# Patient Record
Sex: Male | Born: 1935 | Race: White | Hispanic: No | Marital: Married | Smoking: Never smoker
Health system: Southern US, Community
[De-identification: ages and names within clinical notes are randomized; demographics above are authoritative.]

## PROBLEM LIST (undated history)

## (undated) DIAGNOSIS — N400 Enlarged prostate without lower urinary tract symptoms: Secondary | ICD-10-CM

## (undated) DIAGNOSIS — I10 Essential (primary) hypertension: Secondary | ICD-10-CM

## (undated) DIAGNOSIS — F32A Depression, unspecified: Secondary | ICD-10-CM

## (undated) DIAGNOSIS — K219 Gastro-esophageal reflux disease without esophagitis: Secondary | ICD-10-CM

## (undated) DIAGNOSIS — F0390 Unspecified dementia without behavioral disturbance: Secondary | ICD-10-CM

## (undated) DIAGNOSIS — J301 Allergic rhinitis due to pollen: Secondary | ICD-10-CM

## (undated) DIAGNOSIS — F329 Major depressive disorder, single episode, unspecified: Secondary | ICD-10-CM

## (undated) DIAGNOSIS — E785 Hyperlipidemia, unspecified: Secondary | ICD-10-CM

## (undated) DIAGNOSIS — M159 Polyosteoarthritis, unspecified: Secondary | ICD-10-CM

## (undated) DIAGNOSIS — F4310 Post-traumatic stress disorder, unspecified: Secondary | ICD-10-CM

## (undated) HISTORY — DX: Major depressive disorder, single episode, unspecified: F32.9

## (undated) HISTORY — PX: CATARACT EXTRACTION: SUR2

## (undated) HISTORY — DX: Unspecified dementia without behavioral disturbance: F03.90

## (undated) HISTORY — DX: Hyperlipidemia, unspecified: E78.5

## (undated) HISTORY — PX: TONSILLECTOMY: SUR1361

## (undated) HISTORY — DX: Allergic rhinitis due to pollen: J30.1

## (undated) HISTORY — PX: NOSE SURGERY: SHX723

## (undated) HISTORY — DX: Polyosteoarthritis, unspecified: M15.9

## (undated) HISTORY — DX: Gastro-esophageal reflux disease without esophagitis: K21.9

## (undated) HISTORY — DX: Post-traumatic stress disorder, unspecified: F43.10

## (undated) HISTORY — DX: Depression, unspecified: F32.A

## (undated) HISTORY — DX: Essential (primary) hypertension: I10

## (undated) HISTORY — DX: Benign prostatic hyperplasia without lower urinary tract symptoms: N40.0

## (undated) HISTORY — PX: OTHER SURGICAL HISTORY: SHX169

## (undated) HISTORY — PX: APPENDECTOMY: SHX54

---

## 2004-07-23 ENCOUNTER — Ambulatory Visit: Payer: Self-pay | Admitting: Family Medicine

## 2004-08-07 ENCOUNTER — Ambulatory Visit: Payer: Self-pay | Admitting: Family Medicine

## 2006-05-13 ENCOUNTER — Emergency Department: Payer: Self-pay | Admitting: Emergency Medicine

## 2007-02-21 ENCOUNTER — Ambulatory Visit: Payer: Self-pay | Admitting: Family Medicine

## 2008-01-18 ENCOUNTER — Inpatient Hospital Stay: Payer: Self-pay | Admitting: General Surgery

## 2008-01-18 ENCOUNTER — Other Ambulatory Visit: Payer: Self-pay

## 2008-10-30 ENCOUNTER — Emergency Department: Payer: Self-pay | Admitting: Emergency Medicine

## 2008-11-02 ENCOUNTER — Inpatient Hospital Stay: Payer: Self-pay | Admitting: Internal Medicine

## 2009-02-03 ENCOUNTER — Emergency Department: Payer: Self-pay | Admitting: Emergency Medicine

## 2009-04-24 ENCOUNTER — Telehealth: Payer: Self-pay | Admitting: Internal Medicine

## 2009-06-24 ENCOUNTER — Observation Stay: Payer: Self-pay | Admitting: *Deleted

## 2009-09-18 ENCOUNTER — Ambulatory Visit: Payer: Self-pay | Admitting: Cardiovascular Disease

## 2009-09-18 ENCOUNTER — Ambulatory Visit: Payer: Self-pay | Admitting: Ophthalmology

## 2009-09-23 ENCOUNTER — Emergency Department: Payer: Self-pay | Admitting: Unknown Physician Specialty

## 2009-10-06 ENCOUNTER — Ambulatory Visit: Payer: Self-pay | Admitting: Ophthalmology

## 2010-11-12 ENCOUNTER — Encounter: Payer: Self-pay | Admitting: Podiatry

## 2011-07-13 DIAGNOSIS — F0390 Unspecified dementia without behavioral disturbance: Secondary | ICD-10-CM

## 2011-07-13 HISTORY — DX: Unspecified dementia, unspecified severity, without behavioral disturbance, psychotic disturbance, mood disturbance, and anxiety: F03.90

## 2011-08-11 ENCOUNTER — Emergency Department: Payer: Self-pay | Admitting: Emergency Medicine

## 2011-10-04 ENCOUNTER — Emergency Department: Payer: Self-pay

## 2011-10-04 LAB — CBC
HCT: 44.5 % (ref 40.0–52.0)
HGB: 15 g/dL (ref 13.0–18.0)
MCH: 29.4 pg (ref 26.0–34.0)
MCHC: 33.8 g/dL (ref 32.0–36.0)
MCV: 87 fL (ref 80–100)
Platelet: 170 10*3/uL (ref 150–440)
RBC: 5.11 10*6/uL (ref 4.40–5.90)

## 2011-10-04 LAB — BASIC METABOLIC PANEL
Anion Gap: 13 (ref 7–16)
Calcium, Total: 9.2 mg/dL (ref 8.5–10.1)
Chloride: 104 mmol/L (ref 98–107)
Co2: 28 mmol/L (ref 21–32)
Creatinine: 1.14 mg/dL (ref 0.60–1.30)
EGFR (Non-African Amer.): >60
Potassium: 3 mmol/L — ABNORMAL LOW (ref 3.5–5.1)
Sodium: 145 mmol/L (ref 136–145)

## 2011-11-05 DIAGNOSIS — F068 Other specified mental disorders due to known physiological condition: Secondary | ICD-10-CM

## 2011-11-05 DIAGNOSIS — I10 Essential (primary) hypertension: Secondary | ICD-10-CM

## 2011-11-05 DIAGNOSIS — E785 Hyperlipidemia, unspecified: Secondary | ICD-10-CM

## 2011-11-05 DIAGNOSIS — K219 Gastro-esophageal reflux disease without esophagitis: Secondary | ICD-10-CM

## 2011-11-19 ENCOUNTER — Encounter: Payer: Self-pay | Admitting: Internal Medicine

## 2011-11-19 DIAGNOSIS — I10 Essential (primary) hypertension: Secondary | ICD-10-CM | POA: Insufficient documentation

## 2011-11-19 DIAGNOSIS — M159 Polyosteoarthritis, unspecified: Secondary | ICD-10-CM | POA: Insufficient documentation

## 2011-11-19 DIAGNOSIS — J301 Allergic rhinitis due to pollen: Secondary | ICD-10-CM | POA: Insufficient documentation

## 2011-11-19 DIAGNOSIS — E785 Hyperlipidemia, unspecified: Secondary | ICD-10-CM | POA: Insufficient documentation

## 2011-11-19 DIAGNOSIS — F329 Major depressive disorder, single episode, unspecified: Secondary | ICD-10-CM | POA: Insufficient documentation

## 2011-11-19 DIAGNOSIS — F0390 Unspecified dementia without behavioral disturbance: Secondary | ICD-10-CM | POA: Insufficient documentation

## 2011-11-19 DIAGNOSIS — K219 Gastro-esophageal reflux disease without esophagitis: Secondary | ICD-10-CM | POA: Insufficient documentation

## 2011-11-19 DIAGNOSIS — F4310 Post-traumatic stress disorder, unspecified: Secondary | ICD-10-CM | POA: Insufficient documentation

## 2011-11-25 ENCOUNTER — Non-Acute Institutional Stay: Payer: Medicare Other | Admitting: Internal Medicine

## 2011-11-25 ENCOUNTER — Encounter: Payer: Self-pay | Admitting: Internal Medicine

## 2011-11-25 VITALS — BP 140/56 | HR 58 | Temp 97.6°F | Resp 16 | Wt 181.0 lb

## 2011-11-25 DIAGNOSIS — I10 Essential (primary) hypertension: Secondary | ICD-10-CM

## 2011-11-25 DIAGNOSIS — F0390 Unspecified dementia without behavioral disturbance: Secondary | ICD-10-CM

## 2011-11-25 DIAGNOSIS — E785 Hyperlipidemia, unspecified: Secondary | ICD-10-CM

## 2011-11-25 DIAGNOSIS — K219 Gastro-esophageal reflux disease without esophagitis: Secondary | ICD-10-CM

## 2011-11-25 DIAGNOSIS — J301 Allergic rhinitis due to pollen: Secondary | ICD-10-CM

## 2011-11-25 DIAGNOSIS — M159 Polyosteoarthritis, unspecified: Secondary | ICD-10-CM

## 2011-11-25 NOTE — Assessment & Plan Note (Signed)
Okay on current meds

## 2011-11-25 NOTE — Progress Notes (Signed)
Subjective:    Patient ID: Ricardo Edwards, male    DOB: [birth year], 78 y.o.   MRN: [id number]  HPI Initial visit here in assisted living Just transferred from health care Niece who is POA is here also  Confusion cleared up fairly well Still gets mixed up but much better Needs reminders but does all personal care Staff supervises medications  Happy with the meals Eating well No apparent weight change  Able to walk to health care to see wife twice a day Uses cane  No chest pain  No SOB No sig edema  Stomach has been okay May get momentary indigestion but nothing lasting On the pepcid  Some right hip arthritis pain Rarely uses tylenol  Current Outpatient Prescriptions on File Prior to Visit  Medication Sig Dispense Refill  . amLODipine (NORVASC) 10 MG tablet Take 10 mg by mouth daily.      Marland Kitchen aspirin EC 81 MG tablet Take 81 mg by mouth daily.      . famotidine (PEPCID) 20 MG tablet Take 20 mg by mouth daily.      . fexofenadine (ALLEGRA) 180 MG tablet Take 180 mg by mouth daily as needed.      . fluticasone (FLONASE) 50 MCG/ACT nasal spray Place 2 sprays into the nose daily.      . isosorbide mononitrate (IMDUR) 60 MG 24 hr tablet Take 60 mg by mouth daily.      . polyethylene glycol (MIRALAX / GLYCOLAX) packet Take 17 g by mouth daily.      . potassium chloride SA (K-DUR,KLOR-CON) 20 MEQ tablet Take 20 mEq by mouth 2 (two) times daily.      . psyllium (METAMUCIL SMOOTH TEXTURE) 28 % packet Take 1 packet by mouth daily.      Marland Kitchen triamterene-hydrochlorothiazide (DYAZIDE) 37.5-25 MG per capsule Take 1 capsule by mouth daily.        Allergies  Allergen Reactions  . Metoprolol     Just listed as "beta blockers"  . Penicillins     Past Medical History  Diagnosis Date  . Hearing loss   . Dementia 2013  . PTSD (post-traumatic stress disorder)   . Hyperlipidemia   . GERD (gastroesophageal reflux disease)   . Allergic rhinitis due to pollen   . Osteoarthrosis involving, or  with mention of more than one site, but not specified as generalized, multiple sites   . HTN (hypertension), benign   . Depression     Past Surgical History  Procedure Date  . Appendectomy   . Ear lobe   . Cataract extraction   . Nose surgery   . Tonsillectomy     Family History  Problem Relation Age of Onset  . Diabetes Father   . Hypertension Father   . Gout      NOT KNOWN    History   Social History  . Marital Status: Married    Spouse Name: N/A    Number of Children: 0  . Years of Education: N/A   Occupational History  . 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 Smoking status: Never Smoker   . Smokeless tobacco: Never Used  . Alcohol Use: No  . Drug Use: No  . Sexually Active: Not on file   Other Topics Concern  . Not on file   Social History Narrative   Wife has advanced dementia in health careWife's niece has health care POA.No living willWould accept  CPR for nowNo tube feeds if cognitively unaware   Review of Systems Bowels are okay Notes some swelling in veins in legs--discussed support socks Voids okay--nocturia every 3 hours is stable    Objective:   Physical Exam  Constitutional: He appears well-nourished. No distress.  Neck: Normal range of motion. Neck supple. No thyromegaly present.  Cardiovascular: Normal rate and regular rhythm.  Exam reveals no gallop.   Murmur heard.      Gr 2.6 systolic murmur along left sternal border Faint pedal pulses  Pulmonary/Chest: Effort normal and breath sounds normal. No respiratory distress. He has no wheezes. He has no rales.  Abdominal: Soft. There is no tenderness.  Musculoskeletal: He exhibits no edema and no tenderness.       Dilated varicosities mostly on left calf  Lymphadenopathy:    He has no cervical adenopathy.  Skin:       No ulcers  Psychiatric: He has a normal mood and affect. His behavior is normal.          Assessment & Plan:

## 2011-11-25 NOTE — Assessment & Plan Note (Signed)
The statin might have been involved in his cognitive decline--since no vascular disease---will keep off it

## 2011-11-25 NOTE — Assessment & Plan Note (Signed)
stomach fine on the H2 blocker

## 2011-11-25 NOTE — Assessment & Plan Note (Signed)
Mild memory problems Much better now Seems to have had a component of stress reaction that landed him in health care Appropriate for assisted living now

## 2011-11-25 NOTE — Assessment & Plan Note (Signed)
BP Readings from Last 3 Encounters:  11/25/11 140/56  11/12/10 172/74   Control is fine Not sure why he is on isosorbide---will stop

## 2011-11-25 NOTE — Assessment & Plan Note (Signed)
Mild symptoms Will let him keep tylenol in his room for prn use

## 2011-12-01 ENCOUNTER — Encounter: Payer: Self-pay | Admitting: Internal Medicine

## 2012-01-07 ENCOUNTER — Telehealth: Payer: Self-pay | Admitting: Internal Medicine

## 2012-01-07 NOTE — Telephone Encounter (Signed)
email from [place] Ongoing anxiety Joked about jumping out of window---but no true suicidal ideation Mood not great---?adjustment issues  Repeat potassium 3.1 (she got lab before me)  He wants to try voltaren gel again  P: trial of citaloprma    Okay voltaren    Increase potassium and recheck in 1 month

## 2012-01-14 ENCOUNTER — Telehealth: Payer: Self-pay

## 2012-01-14 NOTE — Telephone Encounter (Signed)
Dr Alphonsus Sias pt; Morrie Sheldon nurse with [facility]; pt was taking K in the morning and at 2 pm. Pt did not want to take at 2 and changed to 6 pm; now pt does not want to take at 6 pm and wants to take K at 7- 8 am and again at 12 noon. Is that OK?

## 2012-01-16 NOTE — Telephone Encounter (Signed)
I don't see why that would be a problem

## 2012-01-27 ENCOUNTER — Encounter: Payer: Self-pay | Admitting: Internal Medicine

## 2012-01-31 ENCOUNTER — Encounter: Payer: Self-pay | Admitting: *Deleted

## 2012-02-02 NOTE — Telephone Encounter (Signed)
Ricardo Edwards, has [facility] been contacted? If so can encounter be closed?

## 2012-02-02 NOTE — Telephone Encounter (Signed)
[facility] and verified that it was ok to switch the medicine time.

## 2012-02-10 ENCOUNTER — Encounter: Payer: Self-pay | Admitting: Internal Medicine

## 2012-02-10 ENCOUNTER — Ambulatory Visit: Payer: Medicare Other | Admitting: Internal Medicine

## 2012-02-10 VITALS — BP 148/54 | HR 54 | Temp 98.3°F | Resp 16 | Wt 184.0 lb

## 2012-02-10 DIAGNOSIS — K219 Gastro-esophageal reflux disease without esophagitis: Secondary | ICD-10-CM

## 2012-02-10 DIAGNOSIS — F0390 Unspecified dementia without behavioral disturbance: Secondary | ICD-10-CM

## 2012-02-10 DIAGNOSIS — F4310 Post-traumatic stress disorder, unspecified: Secondary | ICD-10-CM

## 2012-02-10 DIAGNOSIS — I10 Essential (primary) hypertension: Secondary | ICD-10-CM

## 2012-02-10 NOTE — Assessment & Plan Note (Signed)
Having nighttime symptoms Will add evening famotidine

## 2012-02-10 NOTE — Progress Notes (Signed)
Subjective:    Patient ID: Dantrell Schertzer, male    DOB: Dec 09, 1921, 78 y.o.   MRN: [id number]  HPI Reviewed status with Albin Felling RN Niece is here  He notes slowed thinking--trouble with memory and thinking Increased confusion--called niece in panic at 4AM telling her something terrible happened in city and she shouldn't come (actually the fire alarm went off) Last week voiced thinking about suicide or jumping out window He denies suicidal thinking now---"I tend to exaggerate sometimes" Psychiatric consultation set up  Actually seems better in mood and affect since citalopram stopped  No stomach troubles Satisfied with reflux Rx  No chest pain but he feels "it is getting higher"---feels his stomach "is filling up quicker" Not clear if this is GERD related Only at night No SOB  Current Outpatient Prescriptions on File Prior to Visit  Medication Sig Dispense Refill  . amLODipine (NORVASC) 10 MG tablet Take 10 mg by mouth daily.      Marland Kitchen aspirin EC 81 MG tablet Take 81 mg by mouth daily.      . diclofenac sodium (VOLTAREN) 1 % GEL Apply 1 application topically 3 (three) times daily as needed.      . famotidine (PEPCID) 20 MG tablet Take 20 mg by mouth daily.      . fexofenadine (ALLEGRA) 180 MG tablet Take 180 mg by mouth daily as needed.      . fluticasone (FLONASE) 50 MCG/ACT nasal spray Place 2 sprays into the nose daily.      . polyethylene glycol (MIRALAX / GLYCOLAX) packet Take 17 g by mouth daily.      . potassium chloride SA (K-DUR,KLOR-CON) 20 MEQ tablet Take 40 mEq by mouth 2 (two) times daily.       . psyllium (METAMUCIL SMOOTH TEXTURE) 28 % packet Take 1 packet by mouth daily.      Marland Kitchen triamterene-hydrochlorothiazide (DYAZIDE) 37.5-25 MG per capsule Take 1 capsule by mouth daily.        Allergies  Allergen Reactions  . Metoprolol     Just listed as "beta blockers"  . Penicillins     Past Medical History  Diagnosis Date  . Hearing loss   . Dementia 2013  . PTSD  (post-traumatic stress disorder)   . Hyperlipidemia   . GERD (gastroesophageal reflux disease)   . Allergic rhinitis due to pollen   . Osteoarthrosis involving, or with mention of more than one site, but not specified as generalized, multiple sites   . HTN (hypertension), benign   . Depression     Past Surgical History  Procedure Date  . Appendectomy   . Ear lobe   . Cataract extraction   . Nose surgery   . Tonsillectomy     Family History  Problem Relation Age of Onset  . Diabetes Father   . Hypertension Father   . Gout      NOT KNOWN    History   Social History  . Marital Status: Married    Spouse Name: N/A    Number of Children: 0  . Years of Education: N/A   Occupational History  . 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 Smoking status: Never Smoker   . Smokeless tobacco: Never Used  . Alcohol Use: No  . Drug Use: No  . Sexually Active: Not on file   Other Topics Concern  . Not on file   Social History Narrative   Wife  has advanced dementia in health careWife's niece has health care POA.No living willWould accept CPR for nowNo tube feeds if cognitively unaware   Review of Systems Appetite is okay Weight stable Doesn't sleep great at times but generally okay. Relaxes better if he has seen wife Walks over to health care to see her---enjoys the exercise Left calf lesion--apparent cancer. Having this excised soon     Objective:   Physical Exam  Constitutional: He appears well-developed and well-nourished. No distress.  Neck: Normal range of motion. Neck supple. No thyromegaly present.  Cardiovascular: Normal rate, regular rhythm and normal heart sounds.  Exam reveals no gallop.   No murmur heard. Pulmonary/Chest: Effort normal and breath sounds normal. No respiratory distress. He has no wheezes. He has no rales.  Musculoskeletal: He exhibits no edema and no tenderness.  Lymphadenopathy:    He has no cervical  adenopathy.  Skin: No rash noted.  Psychiatric: He has a normal mood and affect. His behavior is normal.          Assessment & Plan:

## 2012-02-10 NOTE — Assessment & Plan Note (Signed)
Increased confusion  Mild reality issues---confused by fire alarm in middle of night, mild delusions related to wife No Rx

## 2012-02-10 NOTE — Assessment & Plan Note (Signed)
Mentioned suicidal thought but not suicidal Mild psychotic symptoms that don't warrant Rx at this point Will proceed with consultation with Dr Imogene Burn

## 2012-02-10 NOTE — Assessment & Plan Note (Signed)
BP Readings from Last 3 Encounters:  02/10/12 148/54  11/25/11 140/56  11/12/10 172/74   Control is adequate for his age No changes

## 2012-02-11 ENCOUNTER — Ambulatory Visit: Payer: Federal, State, Local not specified - PPO | Admitting: Internal Medicine

## 2012-04-12 ENCOUNTER — Ambulatory Visit: Payer: Self-pay | Admitting: Internal Medicine

## 2012-04-27 ENCOUNTER — Non-Acute Institutional Stay: Payer: Medicare Other | Admitting: Internal Medicine

## 2012-04-27 ENCOUNTER — Encounter: Payer: Self-pay | Admitting: Internal Medicine

## 2012-04-27 VITALS — BP 140/60 | HR 52 | Temp 97.2°F | Resp 20 | Wt 193.0 lb

## 2012-04-27 DIAGNOSIS — M159 Polyosteoarthritis, unspecified: Secondary | ICD-10-CM

## 2012-04-27 DIAGNOSIS — I498 Other specified cardiac arrhythmias: Secondary | ICD-10-CM

## 2012-04-27 DIAGNOSIS — K219 Gastro-esophageal reflux disease without esophagitis: Secondary | ICD-10-CM

## 2012-04-27 DIAGNOSIS — F329 Major depressive disorder, single episode, unspecified: Secondary | ICD-10-CM

## 2012-04-27 DIAGNOSIS — I10 Essential (primary) hypertension: Secondary | ICD-10-CM

## 2012-04-27 DIAGNOSIS — F0390 Unspecified dementia without behavioral disturbance: Secondary | ICD-10-CM

## 2012-04-27 DIAGNOSIS — R001 Bradycardia, unspecified: Secondary | ICD-10-CM | POA: Insufficient documentation

## 2012-04-27 NOTE — Assessment & Plan Note (Signed)
BP Readings from Last 3 Encounters:  04/27/12 140/60  02/10/12 148/54  11/25/11 140/56   Has been okay Some concern with low diastolics Will try decreasing the amlodipine given the bradycardia

## 2012-04-27 NOTE — Assessment & Plan Note (Signed)
No clear symptoms from this but does have vague head feeling Will cut back on amlodipine---could have small effect on heart rate and may help the edema that bothers him Will monitor BP

## 2012-04-27 NOTE — Assessment & Plan Note (Signed)
Cognition seems stable Affect has settled on the namenda--will continue No more perceptual problems (like panicking with the fire alarm)

## 2012-04-27 NOTE — Assessment & Plan Note (Signed)
Generally okay Does have some symptoms that could be from regurgitation No changes for now

## 2012-04-27 NOTE — Assessment & Plan Note (Signed)
Mood seems better on the namenda

## 2012-04-27 NOTE — Progress Notes (Signed)
Subjective:    Patient ID: Ricardo Edwards, male    DOB: 13-May-1922, 78 y.o.   MRN: [id number]  HPI Reviewed status with Albin Felling RN  Doing better mentally No worsening of memory Mood is better now---not having the emotional upheavals Not as confused Started on namenda by Dr Glynis Smiles be the reason why  Just saw Dr Lady Gary Concern due to bradycardia and PVCs on holter Discussed pacemaker but he wasn't interested No dizziness or syncope--still gets sense that "head is blooming" Hasn't fallen  Some trouble with his legs still Some swelling still Has one small red area that has persisted  No heartburn No swallowing problems but does get sense of closing up in throat at times---slows down his eating then  Not using the voltaren gel  Current Outpatient Prescriptions on File Prior to Visit  Medication Sig Dispense Refill  . amLODipine (NORVASC) 10 MG tablet Take 10 mg by mouth daily.      Marland Kitchen aspirin EC 81 MG tablet Take 81 mg by mouth daily.      . diclofenac sodium (VOLTAREN) 1 % GEL Apply 1 application topically 3 (three) times daily as needed.      . famotidine (PEPCID) 20 MG tablet Take 20 mg by mouth 2 (two) times daily.       . fexofenadine (ALLEGRA) 180 MG tablet Take 180 mg by mouth daily as needed.      . fluticasone (FLONASE) 50 MCG/ACT nasal spray Place 2 sprays into the nose daily.      . memantine (NAMENDA) 10 MG tablet Take 10 mg by mouth 2 (two) times daily.      . polyethylene glycol (MIRALAX / GLYCOLAX) packet Take 17 g by mouth daily.      . potassium chloride SA (K-DUR,KLOR-CON) 20 MEQ tablet Take 40 mEq by mouth 2 (two) times daily.       . psyllium (METAMUCIL SMOOTH TEXTURE) 28 % packet Take 1 packet by mouth daily.      Marland Kitchen triamterene-hydrochlorothiazide (DYAZIDE) 37.5-25 MG per capsule Take 1 capsule by mouth daily.        Allergies  Allergen Reactions  . Metoprolol     Just listed as "beta blockers"  . Penicillins     Past Medical History  Diagnosis Date  .  Hearing loss   . Dementia 2013  . PTSD (post-traumatic stress disorder)   . Hyperlipidemia   . GERD (gastroesophageal reflux disease)   . Allergic rhinitis due to pollen   . Osteoarthrosis involving, or with mention of more than one site, but not specified as generalized, multiple sites   . HTN (hypertension), benign   . Depression     Past Surgical History  Procedure Date  . Appendectomy   . Ear lobe   . Cataract extraction   . Nose surgery   . Tonsillectomy     Family History  Problem Relation Age of Onset  . Diabetes Father   . Hypertension Father   . Gout      NOT KNOWN    History   Social History  . Marital Status: Married    Spouse Name: N/A    Number of Children: 0  . Years of Education: N/A   Occupational History  . 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 Smoking status: Never Smoker   . Smokeless tobacco: Never Used  . Alcohol Use: No  . Drug Use: No  . Sexually  Active: Not on file   Other Topics Concern  . Not on file   Social History Narrative   Wife has advanced dementia in health careWife's niece has health care POA.No living willWould accept CPR for nowNo tube feeds if cognitively unaware   Review of Systems Pains in flanks are better Bowels have been fine with laxative Appetite is good Weight is back up ~8#    Objective:   Physical Exam  Constitutional: He appears well-developed and well-nourished. No distress.  Neck: Normal range of motion. Neck supple. No thyromegaly present.  Cardiovascular: Regular rhythm.  Exam reveals no gallop.   No murmur heard.      Mild bradycardia Occ skip beats   Pulmonary/Chest: Effort normal and breath sounds normal. No respiratory distress. He has no wheezes. He has no rales.  Abdominal: Soft. There is no tenderness.  Musculoskeletal: He exhibits edema.       Trace edema Mild swelling in varicosities in calves  Skin:       Small hyperpigmented macule on left  calf--not worrisome  Psychiatric: He has a normal mood and affect. His behavior is normal.          Assessment & Plan:  A

## 2012-04-27 NOTE — Assessment & Plan Note (Signed)
Hasn't been an issue Will stop the voltaren gel

## 2012-05-30 ENCOUNTER — Emergency Department: Payer: Self-pay | Admitting: Emergency Medicine

## 2012-05-30 LAB — COMPREHENSIVE METABOLIC PANEL
Albumin: 3.8 g/dL (ref 3.4–5.0)
Alkaline Phosphatase: 116 U/L (ref 50–136)
Anion Gap: 9 (ref 7–16)
BUN: 16 mg/dL (ref 7–18)
Bilirubin,Total: 0.5 mg/dL (ref 0.2–1.0)
Calcium, Total: 8.9 mg/dL (ref 8.5–10.1)
Chloride: 103 mmol/L (ref 98–107)
Creatinine: 1.09 mg/dL (ref 0.60–1.30)
Osmolality: 282 (ref 275–301)
SGPT (ALT): 24 U/L (ref 12–78)
Sodium: 141 mmol/L (ref 136–145)
Total Protein: 6.9 g/dL (ref 6.4–8.2)

## 2012-05-30 LAB — CBC
HCT: 45.5 % (ref 40.0–52.0)
HGB: 15.5 g/dL (ref 13.0–18.0)
MCH: 29.1 pg (ref 26.0–34.0)
MCHC: 34 g/dL (ref 32.0–36.0)
Platelet: 152 10*3/uL (ref 150–440)
RDW: 14.2 % (ref 11.5–14.5)
WBC: 6.6 10*3/uL (ref 3.8–10.6)

## 2012-05-30 LAB — CK TOTAL AND CKMB (NOT AT ARMC): CK-MB: 4.1 ng/mL — ABNORMAL HIGH (ref 0.5–3.6)

## 2012-05-30 LAB — TROPONIN I: Troponin-I: <0.02 ng/mL

## 2012-06-01 ENCOUNTER — Non-Acute Institutional Stay: Payer: Medicare Other | Admitting: Internal Medicine

## 2012-06-01 ENCOUNTER — Encounter: Payer: Self-pay | Admitting: Internal Medicine

## 2012-06-01 VITALS — BP 200/70 | HR 60 | Temp 97.6°F | Resp 14 | Wt 189.0 lb

## 2012-06-01 DIAGNOSIS — I10 Essential (primary) hypertension: Secondary | ICD-10-CM | POA: Insufficient documentation

## 2012-06-01 DIAGNOSIS — F0390 Unspecified dementia without behavioral disturbance: Secondary | ICD-10-CM

## 2012-06-01 NOTE — Addendum Note (Signed)
Addended by: Tillman Abide I on: 06/01/2012 10:10 AM   Modules accepted: Orders

## 2012-06-01 NOTE — Progress Notes (Signed)
Subjective:    Patient ID: Ricardo Edwards, male    DOB: 03/19/1922, 78 y.o.   MRN: [id number]  HPI Niece is here  Asked to see in ER follow up Sent due to confusion and BP up As high as 224/84 He remembers spell vaguely--saw people around his bed Realizes he was seeing things that weren't there  ER eval apparently negative No changes made BP was still high yesterday so extra 5mg  amlodipine given  No longer having sig hallucinations Occ sees "vision" out of corner of my eye (like diplopia with lateral gaze)  Has been having some headache No chest pain Breathing is okay---motes some trouble with nasal congestion Mild ankle edema ---uses support socks. No sig change  Current Outpatient Prescriptions on File Prior to Visit  Medication Sig Dispense Refill  . amLODipine (NORVASC) 10 MG tablet Take 5 mg by mouth daily.       Marland Kitchen aspirin EC 81 MG tablet Take 81 mg by mouth daily.      . famotidine (PEPCID) 20 MG tablet Take 20 mg by mouth 2 (two) times daily.       . fexofenadine (ALLEGRA) 180 MG tablet Take 180 mg by mouth daily as needed.      . fluticasone (FLONASE) 50 MCG/ACT nasal spray Place 2 sprays into the nose daily.      . memantine (NAMENDA) 10 MG tablet Take 10 mg by mouth 2 (two) times daily.      . metroNIDAZOLE (METROGEL) 1 % gel Apply 1 application topically daily.      . multivitamin-lutein (OCUVITE-LUTEIN) CAPS Take 1 capsule by mouth 2 (two) times daily.      . polyethylene glycol (MIRALAX / GLYCOLAX) packet Take 17 g by mouth daily.      . potassium chloride SA (K-DUR,KLOR-CON) 20 MEQ tablet Take 40 mEq by mouth 2 (two) times daily.       . psyllium (METAMUCIL SMOOTH TEXTURE) 28 % packet Take 1 packet by mouth daily.      Marland Kitchen triamterene-hydrochlorothiazide (DYAZIDE) 37.5-25 MG per capsule Take 1 capsule by mouth daily.        Allergies  Allergen Reactions  . Metoprolol     Just listed as "beta blockers"  . Penicillins     Past Medical History  Diagnosis Date    . Hearing loss   . Dementia 2013  . PTSD (post-traumatic stress disorder)   . Hyperlipidemia   . GERD (gastroesophageal reflux disease)   . Allergic rhinitis due to pollen   . Osteoarthrosis involving, or with mention of more than one site, but not specified as generalized, multiple sites   . HTN (hypertension), benign   . Depression     Past Surgical History  Procedure Date  . Appendectomy   . Ear lobe   . Cataract extraction   . Nose surgery   . Tonsillectomy     Family History  Problem Relation Age of Onset  . Diabetes Father   . Hypertension Father   . Gout      NOT KNOWN    History   Social History  . Marital Status: Married    Spouse Name: N/A    Number of Children: 0  . Years of Education: N/A   Occupational History  . 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 Smoking status: Never Smoker   . Smokeless tobacco: Never Used  . Alcohol Use: No  .  Drug Use: No  . Sexually Active: Not on file   Other Topics Concern  . Not on file   Social History Narrative   Wife has advanced dementia in health careWife's niece has health care POA.No living willWould accept CPR for nowNo tube feeds if cognitively unaware   Review of Systems Some irritation on chest where emergency beacon lies Appetite is okay Sleeping okay    Objective:   Physical Exam  Constitutional: He appears well-developed and well-nourished. No distress.  Neck: Normal range of motion. Neck supple.  Cardiovascular: Normal rate, regular rhythm and normal heart sounds.  Exam reveals no gallop.   No murmur heard. Pulmonary/Chest: Effort normal and breath sounds normal. No respiratory distress. He has no wheezes. He has no rales.  Abdominal:       No renal bruits  Musculoskeletal:       Trace edema  Lymphadenopathy:    He has no cervical adenopathy.  Neurological:       No thought process disturbance Has insight---remembers the hallucinations and  details of ER visit  Psychiatric: He has a normal mood and affect. His behavior is normal.          Assessment & Plan:

## 2012-06-01 NOTE — Assessment & Plan Note (Signed)
Had episode of increased confusion and visual hallucinations with increased BP Seems back to baseline now

## 2012-06-01 NOTE — Assessment & Plan Note (Signed)
Repeat after our talking was 224/78 Clear now mentally No overt symptoms but needs more aggressive Rx Will get and review the ER records  Increase amlodipine to 10 Change diuretic to lisinopril/HCTZ 20/25 Watch BP and labs

## 2012-06-26 ENCOUNTER — Encounter: Payer: Self-pay | Admitting: Internal Medicine

## 2012-07-12 DEATH — deceased

## 2012-07-24 ENCOUNTER — Encounter: Payer: Self-pay | Admitting: Internal Medicine

## 2012-07-24 DIAGNOSIS — Z0279 Encounter for issue of other medical certificate: Secondary | ICD-10-CM

## 2012-08-31 ENCOUNTER — Non-Acute Institutional Stay: Payer: Medicare Other | Admitting: Internal Medicine

## 2012-08-31 ENCOUNTER — Encounter: Payer: Self-pay | Admitting: Internal Medicine

## 2012-08-31 VITALS — BP 152/60 | HR 50 | Temp 97.9°F | Resp 18 | Wt 192.0 lb

## 2012-08-31 DIAGNOSIS — I10 Essential (primary) hypertension: Secondary | ICD-10-CM

## 2012-08-31 DIAGNOSIS — F0390 Unspecified dementia without behavioral disturbance: Secondary | ICD-10-CM

## 2012-08-31 DIAGNOSIS — F329 Major depressive disorder, single episode, unspecified: Secondary | ICD-10-CM

## 2012-08-31 DIAGNOSIS — M159 Polyosteoarthritis, unspecified: Secondary | ICD-10-CM

## 2012-08-31 DIAGNOSIS — K219 Gastro-esophageal reflux disease without esophagitis: Secondary | ICD-10-CM

## 2012-08-31 DIAGNOSIS — F4310 Post-traumatic stress disorder, unspecified: Secondary | ICD-10-CM

## 2012-08-31 NOTE — Assessment & Plan Note (Signed)
Quiet on the med 

## 2012-08-31 NOTE — Assessment & Plan Note (Signed)
Psychotic features have resolved Could be complicated depression too--but did seem to have intrusive thoughts before and flashbacks

## 2012-08-31 NOTE — Assessment & Plan Note (Signed)
Really seems to be better Mood is improved No longer focusing on wife's death Continue paroxetine

## 2012-08-31 NOTE — Assessment & Plan Note (Signed)
Mild memory deficits and some functional needs Stable Will continue the namenda as it may be helping affect

## 2012-08-31 NOTE — Assessment & Plan Note (Signed)
BP Readings from Last 3 Encounters:  08/31/12 152/60  06/01/12 200/70  04/27/12 140/60   Controlled now on current regimen Renal panel normal on these meds also

## 2012-08-31 NOTE — Assessment & Plan Note (Signed)
Tylenol not helping as much Will increase the dose

## 2012-08-31 NOTE — Progress Notes (Signed)
Subjective:    Patient ID: Ricardo Edwards, male    DOB: May 05, 1922, 79 y.o.   MRN: [id number]  HPI Reviewed status with Albin Felling RN Doing fairly well  No sig problems with BP of late Occasional mild occipital headache No chest pain Notes occ skip in heart No dizziness or syncope Has mild calf swelling---still wears the support socks  Seeing psychiatrist Dr Imogene Burn Doing better Now on paroxetine No hallucinations or delusions---relates some of the vision issues to his glasses  Stomach has been fine No heartburn issues  Does have some arthritis pain Tylenol 500mg  helps only a little--thinks he needs higher dose  Current Outpatient Prescriptions on File Prior to Visit  Medication Sig Dispense Refill  . amLODipine (NORVASC) 10 MG tablet Take 10 mg by mouth daily.       Marland Kitchen aspirin EC 81 MG tablet Take 81 mg by mouth daily.      . famotidine (PEPCID) 20 MG tablet Take 20 mg by mouth 2 (two) times daily.       . fexofenadine (ALLEGRA) 180 MG tablet Take 180 mg by mouth daily as needed.      . fluticasone (FLONASE) 50 MCG/ACT nasal spray Place 2 sprays into the nose daily.      Marland Kitchen lisinopril-hydrochlorothiazide (PRINZIDE,ZESTORETIC) 20-25 MG per tablet Take 1 tablet by mouth daily.      . memantine (NAMENDA) 10 MG tablet Take 10 mg by mouth 2 (two) times daily.      . metroNIDAZOLE (METROGEL) 1 % gel Apply 1 application topically daily.      . multivitamin-lutein (OCUVITE-LUTEIN) CAPS Take 1 capsule by mouth 2 (two) times daily.      . potassium chloride SA (K-DUR,KLOR-CON) 20 MEQ tablet Take 40 mEq by mouth 2 (two) times daily.        No current facility-administered medications on file prior to visit.    Allergies  Allergen Reactions  . Metoprolol     Just listed as "beta blockers"  . Penicillins     Past Medical History  Diagnosis Date  . Hearing loss   . Dementia 2013  . PTSD (post-traumatic stress disorder)   . Hyperlipidemia   . GERD (gastroesophageal reflux disease)   .  Allergic rhinitis due to pollen   . Osteoarthrosis involving, or with mention of more than one site, but not specified as generalized, multiple sites   . HTN (hypertension), benign   . Depression     Past Surgical History  Procedure Laterality Date  . Appendectomy    . Ear lobe    . Cataract extraction    . Nose surgery    . Tonsillectomy      Family History  Problem Relation Age of Onset  . Diabetes Father   . Hypertension Father   . Gout      NOT KNOWN    History   Social History  . Marital Status: Married    Spouse Name: N/A    Number of Children: 0  . Years of Education: N/A   Occupational History  . 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 Smoking status: Never Smoker   . Smokeless tobacco: Never Used  . Alcohol Use: No  . Drug Use: No  . Sexually Active: Not on file   Other Topics Concern  . Not on file   Social History Narrative   Wife has advanced dementia in health care   Wife's  niece has health care POA.   No living will   Would accept CPR for now   No tube feeds if cognitively unaware   Review of Systems Sleeps well Appetite is "too good" Weight is up about 3# Had some loose stools ----better now off miralax and metamucil     Objective:   Physical Exam  Constitutional: He appears well-developed and well-nourished. No distress.  Neck: Normal range of motion. Neck supple. No thyromegaly present.  Cardiovascular: Regular rhythm and normal heart sounds.  Exam reveals no gallop.   No murmur heard. Mild bradycardia  Pulmonary/Chest: Effort normal and breath sounds normal. No respiratory distress. He has no wheezes. He has no rales.  Abdominal: Soft. There is no tenderness.  Musculoskeletal: He exhibits edema.  Trace calf edema without sig pitting  Lymphadenopathy:    He has no cervical adenopathy.  Psychiatric: He has a normal mood and affect. His behavior is normal.  Normal conversation and  interaction          Assessment & Plan:

## 2012-11-02 ENCOUNTER — Non-Acute Institutional Stay (INDEPENDENT_AMBULATORY_CARE_PROVIDER_SITE_OTHER): Payer: Medicare Other | Admitting: Internal Medicine

## 2012-11-02 ENCOUNTER — Encounter: Payer: Self-pay | Admitting: Internal Medicine

## 2012-11-02 VITALS — BP 140/58 | HR 52 | Temp 98.3°F | Resp 14 | Wt 198.0 lb

## 2012-11-02 DIAGNOSIS — J301 Allergic rhinitis due to pollen: Secondary | ICD-10-CM

## 2012-11-02 DIAGNOSIS — I10 Essential (primary) hypertension: Secondary | ICD-10-CM

## 2012-11-02 DIAGNOSIS — M159 Polyosteoarthritis, unspecified: Secondary | ICD-10-CM

## 2012-11-02 DIAGNOSIS — F0390 Unspecified dementia without behavioral disturbance: Secondary | ICD-10-CM

## 2012-11-02 DIAGNOSIS — F3289 Other specified depressive episodes: Secondary | ICD-10-CM

## 2012-11-02 DIAGNOSIS — F329 Major depressive disorder, single episode, unspecified: Secondary | ICD-10-CM

## 2012-11-02 NOTE — Assessment & Plan Note (Signed)
Mild with perhaps slight progression Doesn't seem to be Alzheimer's pattern Still pretty much independent with ADLs--- appropriate for assisted living still

## 2012-11-02 NOTE — Assessment & Plan Note (Signed)
Chronic and he admits he has always been "moody" Still grieving wife No longer significant psychosis though mild paranoia at times Needs to stay on meds

## 2012-11-02 NOTE — Assessment & Plan Note (Signed)
Just using the flonase now

## 2012-11-02 NOTE — Progress Notes (Signed)
Subjective:    Patient ID: Ricardo Edwards, male    DOB: [birth year], 79 y.o.   MRN: [id number]  HPI Reviewed status with Albin Felling RN  Still acts unusual at times--mild paranoia Still depressed at times---he admits this.  Doesn't feel this is any worse and still grieves wife Some degree of anhedonia Still seeing Dr Imogene Burn  Mild confusion persists Doesn't seem to be much worse--he notes slight decline Still able to be independent  Walks regular  Uses cane No recent falls Can have some pain in joints---seems to be better after walking (or uses tylenol)  No chest pain or SOB No dizziness or syncope Mild edema--wears suppport socks and these help  Current Outpatient Prescriptions on File Prior to Visit  Medication Sig Dispense Refill  . acetaminophen (TYLENOL) 500 MG tablet Take 1,000 mg by mouth 3 (three) times daily as needed for pain.       Marland Kitchen amLODipine (NORVASC) 10 MG tablet Take 10 mg by mouth daily.       Marland Kitchen aspirin EC 81 MG tablet Take 81 mg by mouth daily.      . famotidine (PEPCID) 20 MG tablet Take 20 mg by mouth 2 (two) times daily.       . fluticasone (FLONASE) 50 MCG/ACT nasal spray Place 2 sprays into the nose daily.      Marland Kitchen lisinopril-hydrochlorothiazide (PRINZIDE,ZESTORETIC) 20-25 MG per tablet Take 1 tablet by mouth daily.      . memantine (NAMENDA) 10 MG tablet Take 10 mg by mouth 2 (two) times daily.      . metroNIDAZOLE (METROGEL) 1 % gel Apply 1 application topically daily.      . multivitamin-lutein (OCUVITE-LUTEIN) CAPS Take 1 capsule by mouth 2 (two) times daily.      Marland Kitchen PARoxetine (PAXIL) 10 MG tablet Take 10 mg by mouth daily.      . potassium chloride SA (K-DUR,KLOR-CON) 20 MEQ tablet Take 40 mEq by mouth 2 (two) times daily.        No current facility-administered medications on file prior to visit.    Allergies  Allergen Reactions  . Metoprolol     Just listed as "beta blockers"  . Penicillins     Past Medical History  Diagnosis Date  . Hearing loss    . Dementia 2013  . PTSD (post-traumatic stress disorder)   . Hyperlipidemia   . GERD (gastroesophageal reflux disease)   . Allergic rhinitis due to pollen   . Osteoarthrosis involving, or with mention of more than one site, but not specified as generalized, multiple sites   . HTN (hypertension), benign   . Depression     Past Surgical History  Procedure Laterality Date  . Appendectomy    . Ear lobe    . Cataract extraction    . Nose surgery    . Tonsillectomy      Family History  Problem Relation Age of Onset  . Diabetes Father   . Hypertension Father   . Gout      NOT KNOWN    History   Social History  . Marital Status: Married    Spouse Name: N/A    Number of Children: 0  . Years of Education: N/A   Occupational History  . 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 Smoking status: Never Smoker   . Smokeless tobacco: Never Used  . Alcohol Use: No  . Drug Use: No  .  Sexually Active: Not on file   Other Topics Concern  . Not on file   Social History Narrative   Wife has advanced dementia in health care   Wife's niece has health care POA.   No living will   Would accept CPR for now   No tube feeds if cognitively unaware   Review of Systems On the flonase for allergies. Not on fexofenadine now Appetite is fine Weight is stable---tries to be careful Sleeps fairly well--some early arising. Nocturia x 2 No stomach trouble--no regular heartburn    Objective:   Physical Exam  Constitutional: He appears well-developed and well-nourished. No distress.  Neck: Normal range of motion. Neck supple. No thyromegaly present.  Cardiovascular: Normal rate, regular rhythm and normal heart sounds.  Exam reveals no gallop.   No murmur heard. Rate up to 60 now  Pulmonary/Chest: Effort normal and breath sounds normal. No respiratory distress. He has no wheezes. He has no rales.  Abdominal: Soft. There is no tenderness.   Musculoskeletal: He exhibits no tenderness.  Trace edema in calves without pitting  Lymphadenopathy:    He has no cervical adenopathy.  Psychiatric:  Not overtly depressed Somewhat constricted affect Normal speech and appearance          Assessment & Plan:

## 2012-11-02 NOTE — Assessment & Plan Note (Signed)
Mild symptoms Does okay with the tylenol

## 2012-11-02 NOTE — Assessment & Plan Note (Signed)
BP Readings from Last 3 Encounters:  11/02/12 140/58  08/31/12 152/60  06/01/12 200/70   Has not been as labile No changes needed now

## 2012-12-20 ENCOUNTER — Other Ambulatory Visit: Payer: Self-pay | Admitting: Internal Medicine

## 2012-12-20 NOTE — Telephone Encounter (Signed)
rx sent to pharmacy by e-script  

## 2012-12-20 NOTE — Telephone Encounter (Signed)
Okay for a year---[facility]

## 2013-01-25 ENCOUNTER — Encounter: Payer: Self-pay | Admitting: Internal Medicine

## 2013-01-25 ENCOUNTER — Non-Acute Institutional Stay: Payer: Medicare Other | Admitting: Internal Medicine

## 2013-01-25 VITALS — BP 130/60 | HR 64 | Temp 98.6°F | Resp 18 | Wt 207.0 lb

## 2013-01-25 DIAGNOSIS — F4310 Post-traumatic stress disorder, unspecified: Secondary | ICD-10-CM

## 2013-01-25 DIAGNOSIS — F329 Major depressive disorder, single episode, unspecified: Secondary | ICD-10-CM

## 2013-01-25 DIAGNOSIS — M159 Polyosteoarthritis, unspecified: Secondary | ICD-10-CM

## 2013-01-25 DIAGNOSIS — I10 Essential (primary) hypertension: Secondary | ICD-10-CM

## 2013-01-25 DIAGNOSIS — F0390 Unspecified dementia without behavioral disturbance: Secondary | ICD-10-CM

## 2013-01-25 NOTE — Assessment & Plan Note (Signed)
Mood is stable on the paroxetine Sees Dr Imogene Burn still

## 2013-01-25 NOTE — Assessment & Plan Note (Signed)
Mild without progression Is independent with ADLs  Doing okay in AL again

## 2013-01-25 NOTE — Assessment & Plan Note (Signed)
Hands and hips mainly Does okay with tylenol

## 2013-01-25 NOTE — Progress Notes (Signed)
Subjective:    Patient ID: Ricardo Edwards, male    DOB: 07/01/1922, 79 y.o.   MRN: [id number]  HPI Reviewed status with RN  Still has "grumpy, grouchy" side No major depression Still prefers to be by himself---still goes to dining room regularly No serious paranoia of late Somewhat delusional---now talking about how her wife was being "sucked in by the religion here" before she died (though she died in health care after long severe dementia) Still with episodic confusion No elopement concerns  No chest pain of note Some recurrent back and subxiphoid pain--better with pain pill (tylenol) No SOB Some wobbly feeling without falls No true dizziness Uses cane regularly  No heartburn or nausea Bowels are "fouled up" now---some loose stools No blood  Voids okay Nocturia x 2 is stable  Current Outpatient Prescriptions on File Prior to Visit  Medication Sig Dispense Refill  . acetaminophen (TYLENOL) 500 MG tablet Take 1,000 mg by mouth 3 (three) times daily as needed for pain.       Marland Kitchen amLODipine (NORVASC) 10 MG tablet Take 10 mg by mouth daily.       . ASPIRIN LOW DOSE 81 MG EC tablet TAKE ONE TABLET PO DAILY.    ** DO NOT CRUSH. ** ANTI-PLATELET AGGREGATION  30 tablet  11  . famotidine (PEPCID) 20 MG tablet Take 20 mg by mouth 2 (two) times daily.       . fluticasone (FLONASE) 50 MCG/ACT nasal spray Place 2 sprays into the nose daily.      Marland Kitchen lisinopril-hydrochlorothiazide (PRINZIDE,ZESTORETIC) 20-25 MG per tablet Take 1 tablet by mouth daily.      . metroNIDAZOLE (METROGEL) 1 % gel Apply 1 application topically daily.      . multivitamin-lutein (OCUVITE-LUTEIN) CAPS Take 1 capsule by mouth 2 (two) times daily.      Marland Kitchen PARoxetine (PAXIL) 10 MG tablet Take 10 mg by mouth daily.      . potassium chloride SA (K-DUR,KLOR-CON) 20 MEQ tablet Take 40 mEq by mouth 2 (two) times daily.        No current facility-administered medications on file prior to visit.    Allergies  Allergen Reactions   . Metoprolol     Just listed as "beta blockers"  . Penicillins     Past Medical History  Diagnosis Date  . Hearing loss   . Dementia 2013  . PTSD (post-traumatic stress disorder)   . Hyperlipidemia   . GERD (gastroesophageal reflux disease)   . Allergic rhinitis due to pollen   . Osteoarthrosis involving, or with mention of more than one site, but not specified as generalized, multiple sites   . HTN (hypertension), benign   . Depression     Past Surgical History  Procedure Laterality Date  . Appendectomy    . Ear lobe    . Cataract extraction    . Nose surgery    . Tonsillectomy      Family History  Problem Relation Age of Onset  . Diabetes Father   . Hypertension Father   . Gout      NOT KNOWN    History   Social History  . Marital Status: Married    Spouse Name: N/A    Number of Children: 0  . Years of Education: N/A   Occupational History  . 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 Smoking status: Never Smoker   . Smokeless tobacco: Never Used  .  Alcohol Use: No  . Drug Use: No  . Sexually Active: Not on file   Other Topics Concern  . Not on file   Social History Narrative   Wife has advanced dementia in health care   Wife's niece has health care POA.   No living will   Would accept CPR for now   No tube feeds if cognitively unaware   Review of Systems Appetite is "too good" Has put on 10# or so---he will try to monitor this    Objective:   Physical Exam  Constitutional: He appears well-developed and well-nourished. No distress.  Neck: Normal range of motion. Neck supple. No thyromegaly present.  Cardiovascular: Normal rate, regular rhythm and normal heart sounds.  Exam reveals no gallop.   No murmur heard. Pulmonary/Chest: Effort normal and breath sounds normal. No respiratory distress. He has no wheezes. He has no rales.  Abdominal: Soft. There is no tenderness.  Musculoskeletal: He exhibits  edema.  1+ non pitting edema in calves---has support socks on  Lymphadenopathy:    He has no cervical adenopathy.  Psychiatric: He has a normal mood and affect. His behavior is normal.          Assessment & Plan:

## 2013-01-25 NOTE — Assessment & Plan Note (Signed)
Seems to have resolved Mild delusions but no flashbacks or intrusive thoughts apparent now

## 2013-01-25 NOTE — Assessment & Plan Note (Signed)
BP Readings from Last 3 Encounters:  01/25/13 130/60  11/02/12 140/58  08/31/12 152/60   Good control No changes needed

## 2013-02-01 ENCOUNTER — Other Ambulatory Visit: Payer: Self-pay | Admitting: Internal Medicine

## 2013-02-02 NOTE — Telephone Encounter (Signed)
rx sent to pharmacy by e-script  

## 2013-02-02 NOTE — Telephone Encounter (Signed)
Okay for a year 

## 2013-02-15 ENCOUNTER — Other Ambulatory Visit: Payer: Self-pay | Admitting: Internal Medicine

## 2013-03-08 ENCOUNTER — Encounter: Payer: Self-pay | Admitting: Internal Medicine

## 2013-03-08 ENCOUNTER — Non-Acute Institutional Stay: Payer: Medicare Other | Admitting: Internal Medicine

## 2013-03-08 VITALS — BP 148/60 | HR 68 | Temp 98.9°F | Resp 18 | Wt 207.0 lb

## 2013-03-08 DIAGNOSIS — F329 Major depressive disorder, single episode, unspecified: Secondary | ICD-10-CM

## 2013-03-08 DIAGNOSIS — N400 Enlarged prostate without lower urinary tract symptoms: Secondary | ICD-10-CM | POA: Insufficient documentation

## 2013-03-08 DIAGNOSIS — J029 Acute pharyngitis, unspecified: Secondary | ICD-10-CM | POA: Insufficient documentation

## 2013-03-08 NOTE — Assessment & Plan Note (Signed)
Urinary symptoms not suggestive of UTI Mostly troubling nocturia Will try tamsulosin---watch for dizziness

## 2013-03-08 NOTE — Assessment & Plan Note (Signed)
Ongoing mood problems May be underlying the staff concerns about stomach Clearly still has social uneasiness Will try increasing the paroxetine

## 2013-03-08 NOTE — Assessment & Plan Note (Signed)
Seems viral Better as it loosened up some Will try mucinex prn Antibiotics not indicated

## 2013-03-08 NOTE — Progress Notes (Signed)
Subjective:    Patient ID: Ricardo Edwards, male    DOB: [birth year], 79 y.o.   MRN: [id number]  HPI Reviewed status with Angelica Chessman RN Has had chest and throat congestion over the past couple of days Now with throat pain--deep by sternal notch Voice is off  More painful last night--but then started breaking up some by midnight Some cough--really hurt. No sputum No sig head congestion or drainage Some sneezes Mild right ear pain 3-4 days ago No fever Some DOE with walking  Has had some urinary difficulty Nocturia--tries to use urinal but trouble controlling this. Up at least 5 times! Does okay if he goes to the commode No dysuria or hematuria Generally does okay in daytime  Staff have noticed decreased appetite He states he is trying to lose weight by eating less Some epigastric abdominal pain ---but he doesn't endorse this now  Still jumpy  Anxiety persists Not clear if GI symptoms are related to this He states his nerves act up when the staff tries to get him to be involved with activities---prefers to be alone  Current Outpatient Prescriptions on File Prior to Visit  Medication Sig Dispense Refill  . acetaminophen (TYLENOL) 500 MG tablet Take 1,000 mg by mouth 3 (three) times daily as needed for pain.       Marland Kitchen amLODipine (NORVASC) 10 MG tablet Take 10 mg by mouth daily.       . ASPIRIN LOW DOSE 81 MG EC tablet TAKE ONE TABLET PO DAILY.    ** DO NOT CRUSH. ** ANTI-PLATELET AGGREGATION  30 tablet  11  . famotidine (PEPCID) 20 MG tablet TAKE ONE TABLET BY MOUTH ONCE TWICE DAILY. (GERD/REFLUX/STOMACH)  60 tablet  11  . fluticasone (FLONASE) 50 MCG/ACT nasal spray Place 2 sprays into the nose daily.      Marland Kitchen lisinopril-hydrochlorothiazide (PRINZIDE,ZESTORETIC) 20-25 MG per tablet Take 1 tablet by mouth daily.      . Memantine HCl ER (NAMENDA XR) 28 MG CP24 Take 1 capsule by mouth daily.      . metroNIDAZOLE (METROGEL) 1 % gel Apply 1 application topically daily.      . multivitamin-lutein  (OCUVITE-LUTEIN) CAPS Take 1 capsule by mouth 2 (two) times daily.      Marland Kitchen PARoxetine (PAXIL) 10 MG tablet Take 10 mg by mouth daily.      . potassium chloride SA (K-DUR,KLOR-CON) 20 MEQ tablet TAKE 2 TABLETS BY MOUTH EACH MORNING AND AT LUNCH FOR POTASSIUM SUPPLEMENT. MAY DISSOLVE/DO NOT CRUSH.  120 tablet  11   No current facility-administered medications on file prior to visit.    Allergies  Allergen Reactions  . Metoprolol     Just listed as "beta blockers"  . Penicillins     Past Medical History  Diagnosis Date  . Hearing loss   . Dementia 2013  . PTSD (post-traumatic stress disorder)   . Hyperlipidemia   . GERD (gastroesophageal reflux disease)   . Allergic rhinitis due to pollen   . Osteoarthrosis involving, or with mention of more than one site, but not specified as generalized, multiple sites   . HTN (hypertension), benign   . Depression     Past Surgical History  Procedure Laterality Date  . Appendectomy    . Ear lobe    . Cataract extraction    . Nose surgery    . Tonsillectomy      Family History  Problem Relation Age of Onset  . Diabetes Father   . Hypertension Father   .  Gout      NOT KNOWN    History   Social History  . Marital Status: Married    Spouse Name: N/A    Number of Children: 0  . Years of Education: N/A   Occupational History  . 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 Smoking status: Never Smoker   . Smokeless tobacco: Never Used  . Alcohol Use: No  . Drug Use: No  . Sexual Activity: Not on file   Other Topics Concern  . Not on file   Social History Narrative   Wife has advanced dementia in health care   Wife's niece has health care POA.   No living will   Would accept CPR for now   No tube feeds if cognitively unaware   Review of Systems Some neck pain--relates to twisting his head Bowels are regular     Objective:   Physical Exam  Constitutional: He appears well-developed  and well-nourished. No distress.  HENT:  Mouth/Throat: Oropharynx is clear and moist. No oropharyngeal exudate.  Neck: Normal range of motion. Neck supple. No thyromegaly present.  Cardiovascular: Normal rate, regular rhythm and normal heart sounds.   Pulmonary/Chest: Breath sounds normal. No respiratory distress. He has no wheezes. He has no rales.  Abdominal: Soft. There is no tenderness.  Musculoskeletal: He exhibits no edema and no tenderness.  Lymphadenopathy:    He has no cervical adenopathy.  Psychiatric:  Engages normally Admits some social issues with others--rushes to leave dining room to get back to room, etc          Assessment & Plan:

## 2013-03-13 DIAGNOSIS — T148XXA Other injury of unspecified body region, initial encounter: Secondary | ICD-10-CM

## 2013-03-13 DIAGNOSIS — J029 Acute pharyngitis, unspecified: Secondary | ICD-10-CM

## 2013-03-14 ENCOUNTER — Telehealth: Payer: Self-pay | Admitting: Internal Medicine

## 2013-03-14 ENCOUNTER — Inpatient Hospital Stay: Payer: Self-pay | Admitting: Internal Medicine

## 2013-03-14 LAB — COMPREHENSIVE METABOLIC PANEL
Albumin: 2.9 g/dL — ABNORMAL LOW (ref 3.4–5.0)
Alkaline Phosphatase: 109 U/L (ref 50–136)
BUN: 30 mg/dL — ABNORMAL HIGH (ref 7–18)
Chloride: 100 mmol/L (ref 98–107)
Creatinine: 1.41 mg/dL — ABNORMAL HIGH (ref 0.60–1.30)
EGFR (Non-African Amer.): 44 — ABNORMAL LOW
Glucose: 125 mg/dL — ABNORMAL HIGH (ref 65–99)
SGOT(AST): 29 U/L (ref 15–37)
SGPT (ALT): 29 U/L (ref 12–78)
Total Protein: 6.8 g/dL (ref 6.4–8.2)

## 2013-03-14 LAB — URINALYSIS, COMPLETE
Bilirubin,UR: NEGATIVE
Glucose,UR: NEGATIVE mg/dL (ref 0–75)
Hyaline Cast: 5
Leukocyte Esterase: NEGATIVE
Nitrite: NEGATIVE
Ph: 5 (ref 4.5–8.0)
RBC,UR: 2 /HPF (ref 0–5)
Specific Gravity: 1.019 (ref 1.003–1.030)
Squamous Epithelial: NONE SEEN

## 2013-03-14 LAB — CK TOTAL AND CKMB (NOT AT ARMC): CK, Total: 190 U/L (ref 35–232)

## 2013-03-14 LAB — CBC
HCT: 39.7 % — ABNORMAL LOW (ref 40.0–52.0)
HGB: 13.6 g/dL (ref 13.0–18.0)
MCH: 29.5 pg (ref 26.0–34.0)
MCHC: 34.2 g/dL (ref 32.0–36.0)
Platelet: 216 10*3/uL (ref 150–440)

## 2013-03-14 LAB — TROPONIN I: Troponin-I: <0.02 ng/mL

## 2013-03-14 NOTE — Telephone Encounter (Signed)
Pt's niece, Larrie Kass called and is upset b/c she says you came to visit pt at the nursing facility [facility]) on 03/08/2013. She says she doesn't feel you took his symptoms serious and today he is being admitted into the hospital [facility]) w/pnuemonia, sinus infection, UTI, and slightly dehydrated. She wanted to make you aware of this.

## 2013-03-15 ENCOUNTER — Telehealth: Payer: Self-pay | Admitting: Internal Medicine

## 2013-03-15 LAB — BASIC METABOLIC PANEL
BUN: 20 mg/dL — ABNORMAL HIGH (ref 7–18)
Calcium, Total: 9.1 mg/dL (ref 8.5–10.1)
Chloride: 101 mmol/L (ref 98–107)
Co2: 30 mmol/L (ref 21–32)
EGFR (African American): >60
Glucose: 112 mg/dL — ABNORMAL HIGH (ref 65–99)
Osmolality: 279 (ref 275–301)
Potassium: 3.3 mmol/L — ABNORMAL LOW (ref 3.5–5.1)

## 2013-03-15 LAB — CBC WITH DIFFERENTIAL/PLATELET
Basophil #: 0 10*3/uL (ref 0.0–0.1)
Basophil %: 0.5 %
Eosinophil #: 0.3 10*3/uL (ref 0.0–0.7)
Eosinophil %: 3.5 %
Lymphocyte #: 0.7 10*3/uL — ABNORMAL LOW (ref 1.0–3.6)
Lymphocyte %: 7.5 %
MCHC: 34.6 g/dL (ref 32.0–36.0)
MCV: 86 fL (ref 80–100)
Platelet: 210 10*3/uL (ref 150–440)
RBC: 4.35 10*6/uL — ABNORMAL LOW (ref 4.40–5.90)
RDW: 13.7 % (ref 11.5–14.5)
WBC: 8.9 10*3/uL (ref 3.8–10.6)

## 2013-03-15 LAB — LIPID PANEL
Cholesterol: 168 mg/dL (ref 0–200)
HDL Cholesterol: 31 mg/dL — ABNORMAL LOW (ref 40–60)
Ldl Cholesterol, Calc: 119 mg/dL — ABNORMAL HIGH (ref 0–100)
VLDL Cholesterol, Calc: 18 mg/dL (ref 5–40)

## 2013-03-15 LAB — T4, FREE: Free Thyroxine: 1.43 ng/dL (ref 0.76–1.46)

## 2013-03-15 LAB — TSH: Thyroid Stimulating Horm: 0.214 u[IU]/mL — ABNORMAL LOW

## 2013-03-15 NOTE — Telephone Encounter (Signed)
Reviewed last week's note He did have congestion and cough----didn't seem particularly worrisome I was concerned about urinary retention so started the tamsulosin  I will review his status with niece when he gets back to health care

## 2013-03-15 NOTE — Telephone Encounter (Signed)
I understand her concerns There were no signs of those infections either at that visit or even when I saw him on Tuesday--no fever, cough, dysuria, etc Will await disposition at hospital

## 2013-03-18 LAB — BASIC METABOLIC PANEL
Anion Gap: 5 — ABNORMAL LOW (ref 7–16)
BUN: 19 mg/dL — ABNORMAL HIGH (ref 7–18)
Calcium, Total: 9 mg/dL (ref 8.5–10.1)
Chloride: 97 mmol/L — ABNORMAL LOW (ref 98–107)
Creatinine: 1.08 mg/dL (ref 0.60–1.30)
EGFR (African American): >60
Osmolality: 275 (ref 275–301)

## 2013-03-18 LAB — CBC WITH DIFFERENTIAL/PLATELET
Eosinophil %: 2.1 %
HCT: 40.1 % (ref 40.0–52.0)
Lymphocyte #: 0.7 10*3/uL — ABNORMAL LOW (ref 1.0–3.6)
Lymphocyte %: 7.1 %
MCV: 85 fL (ref 80–100)
Monocyte #: 0.7 x10 3/mm (ref 0.2–1.0)
Monocyte %: 6.7 %
Neutrophil #: 8.3 10*3/uL — ABNORMAL HIGH (ref 1.4–6.5)
Neutrophil %: 83.9 %

## 2013-03-19 ENCOUNTER — Telehealth: Payer: Self-pay

## 2013-03-19 LAB — CULTURE, BLOOD (SINGLE)

## 2013-03-19 LAB — POTASSIUM: Potassium: 3.1 mmol/L — ABNORMAL LOW (ref 3.5–5.1)

## 2013-03-19 LAB — PLATELET COUNT: Platelet: 233 10*3/uL (ref 150–440)

## 2013-03-19 NOTE — Telephone Encounter (Signed)
pts niece Clydie Braun left v/m that pt is returning to [facility] today from hospital and will be in skilled nursing division for PT. Clydie Braun request call back from Dr Alphonsus Sias because of what has transpired and wants to make sure they are on same page.Clydie Braun also request Dr Dayton Martes to call Clydie Braun about taking Mr Cordrey as a patient.

## 2013-03-19 NOTE — Telephone Encounter (Signed)
Discussed with his niece at length  I will meet with both of them tomorrow afternoon  We can decide about future plans at that point

## 2013-03-20 DIAGNOSIS — F0390 Unspecified dementia without behavioral disturbance: Secondary | ICD-10-CM

## 2013-03-20 DIAGNOSIS — F22 Delusional disorders: Secondary | ICD-10-CM

## 2013-03-20 DIAGNOSIS — F068 Other specified mental disorders due to known physiological condition: Secondary | ICD-10-CM

## 2013-03-20 DIAGNOSIS — F411 Generalized anxiety disorder: Secondary | ICD-10-CM

## 2013-03-21 DIAGNOSIS — F411 Generalized anxiety disorder: Secondary | ICD-10-CM

## 2013-03-21 DIAGNOSIS — F329 Major depressive disorder, single episode, unspecified: Secondary | ICD-10-CM

## 2013-03-25 ENCOUNTER — Emergency Department: Payer: Self-pay | Admitting: Emergency Medicine

## 2013-03-25 LAB — COMPREHENSIVE METABOLIC PANEL
Albumin: 2.8 g/dL — ABNORMAL LOW (ref 3.4–5.0)
Alkaline Phosphatase: 111 U/L (ref 50–136)
Bilirubin,Total: 0.4 mg/dL (ref 0.2–1.0)
Calcium, Total: 9 mg/dL (ref 8.5–10.1)
Co2: 21 mmol/L (ref 21–32)
Creatinine: 1.35 mg/dL — ABNORMAL HIGH (ref 0.60–1.30)
EGFR (African American): 54 — ABNORMAL LOW
Glucose: 96 mg/dL (ref 65–99)
SGOT(AST): 28 U/L (ref 15–37)
SGPT (ALT): 40 U/L (ref 12–78)
Sodium: 136 mmol/L (ref 136–145)

## 2013-03-25 LAB — URINALYSIS, COMPLETE
Bilirubin,UR: NEGATIVE
Ketone: NEGATIVE
Leukocyte Esterase: NEGATIVE
Ph: 5 (ref 4.5–8.0)
Protein: 30
RBC,UR: 1 /HPF (ref 0–5)
Specific Gravity: 1.018 (ref 1.003–1.030)
Squamous Epithelial: NONE SEEN
WBC UR: <1 /HPF (ref 0–5)

## 2013-03-25 LAB — CBC
HGB: 14.1 g/dL (ref 13.0–18.0)
RBC: 4.85 10*6/uL (ref 4.40–5.90)

## 2013-03-26 ENCOUNTER — Telehealth: Payer: Self-pay | Admitting: Family Medicine

## 2013-03-26 DIAGNOSIS — F29 Unspecified psychosis not due to a substance or known physiological condition: Secondary | ICD-10-CM

## 2013-03-26 NOTE — Telephone Encounter (Signed)
[facility] Triage Call Report Triage Record Num: [id number] Operator: Claudie Leach Patient Name: Ned Kakar Call Date & Time: 03/25/2013 12:26:22AM Patient Phone: [phone] PCP: Tillman Abide Patient Gender: Male PCP Fax : [phone] Patient DOB: 05-19-1922 Practice Name: [facility] Reason for Call: Caller: Sharon/RN; PCP: Tillman Abide [facility]); CB#: [phone]; Call regarding agitated and combative. Per report patient has been agitated and combative most of the day. Had Ativan 0. 5 gel at 10 pm on 03/24/13 and caller states it did not help. States the has been using a table as a weapon and has twisted one of the employees fingers. History of metabolic encephalopathy. Triaged per Confusion, Disorientation, Agitation guideline. To activate EMS 911 due to combative, aggressive or threatening violence and new or worsening confusion, disorientation or agitation. Instructed to call 911 and send to ED and caller states she will. Protocol(s) Used: Confusion, Disorientation, Agitation Recommended Outcome per Protocol: Activate EMS 911 Reason for Outcome: Combative, aggressive or threatening violence AND new or worsening confusion, disorientation or agitation Care Advice: ~ IMMEDIATE ACTION 03/25/2013 12:38:45AM Page 1 of 1 [email or web address]

## 2013-03-26 NOTE — Telephone Encounter (Signed)
Did come back yesterday  I saw him this morning---- increased risperdal for ongoing pyschosis

## 2013-03-28 DIAGNOSIS — F0390 Unspecified dementia without behavioral disturbance: Secondary | ICD-10-CM

## 2013-04-02 ENCOUNTER — Telehealth: Payer: Self-pay

## 2013-04-02 NOTE — Telephone Encounter (Signed)
Discussed with her Have increased the risperdal Might need to consider transfer to skilled care permanently

## 2013-04-02 NOTE — Telephone Encounter (Signed)
Ricardo Edwards pt's niece request cb with pt's plan of care to keep pt from being combative.Please advise.

## 2013-04-11 DIAGNOSIS — F329 Major depressive disorder, single episode, unspecified: Secondary | ICD-10-CM

## 2013-04-12 DIAGNOSIS — F29 Unspecified psychosis not due to a substance or known physiological condition: Secondary | ICD-10-CM

## 2013-04-12 DIAGNOSIS — I10 Essential (primary) hypertension: Secondary | ICD-10-CM

## 2013-04-12 DIAGNOSIS — F411 Generalized anxiety disorder: Secondary | ICD-10-CM

## 2013-04-12 DIAGNOSIS — F068 Other specified mental disorders due to known physiological condition: Secondary | ICD-10-CM

## 2013-04-23 ENCOUNTER — Telehealth: Payer: Self-pay

## 2013-04-23 NOTE — Telephone Encounter (Signed)
Clydie Braun Ball,pt's niece said when she saw pt on 10/08/ and 04/19/13 pt was coherent and answering questions appropriately. On 04/20/13 and over weekend pt was sitting in [facility], incoherent, not responding to Clydie Braun' questions or acknowledging Clydie Braun at all. Clydie Braun feels pt is over sedated and wants to know when and why Risperdal and Xanax were ordered. Clydie Braun wants to know if med could be ordered to take the edge off but not to the point pt just sits and stairs into space.Clydie Braun also said pt still had on same clothes over weekend that he had on when she visited on 04/18/13. Pt is at [facility].Please advise. Clydie Braun request cb.

## 2013-04-23 NOTE — Telephone Encounter (Signed)
Discussed with Melody, RN.  Pt is very infrequently getting xanax and needs Risperdal given previous psychosis and paranoia. Did not sleep well the night prior which is why he was likely sleepy when she saw him.  Clydie Braun has already contacted [employer], Child psychotherapist at [facility] per [employer] and she has addressed her concerns.  Advised Melody to have Crystal call me if she needed me to contact Clydie Braun as well.

## 2013-04-30 ENCOUNTER — Telehealth: Payer: Self-pay

## 2013-04-30 NOTE — Telephone Encounter (Signed)
Ricardo Edwards pts niece left v/m requesting that Dr Alphonsus Sias not give any more psychotic meds to pt; Ricardo Edwards wants Dr Imogene Burn to handle psychotic meds. Ricardo Edwards request order for physical therapy sent to [facility] in [place] for pt to receive home health PT for pt to begin standing and walking again. Ricardo Edwards request cb that this message was received, I notified Ricardo Edwards message received and sent to Dr Alphonsus Sias. Ricardo Edwards request cb.

## 2013-04-30 NOTE — Telephone Encounter (Signed)
Discussed with her. I made the referral to Dr Imogene Burn so I am happy to defer his antipsychotic Rx to him  I think that [facility] is the exclusive PT provider She checked with [facility] [employer] and he thought he could have an outside company come in I will look into this

## 2013-05-02 NOTE — Telephone Encounter (Signed)
Pt wants cb from Dr Alphonsus Sias.

## 2013-05-02 NOTE — Telephone Encounter (Signed)
i discussed this with her Told her since her only disatisfation with the [facility] was that they stopped, I would try them again first. She stated that this was just a misunderstanding then---and voiced approval for the in house therapy team to work with him. Discussed hopefully being able to transition him to restorative program when he has reached maximum benefit from the PT

## 2013-05-02 NOTE — Telephone Encounter (Addendum)
Pt called back and wants to discuss PT order with Dr Alphonsus Sias since Dr Alphonsus Sias signed order with [facility] PT. Clydie Braun was talking with Genevieve Norlander PT. Clydie Braun request cb ASAP to clarify about PT. Clydie Braun being told nurses cannot walk with pt when pt is not in PT. Pt does not want to stay in [place] chair all the time. Please advise.

## 2013-05-09 ENCOUNTER — Ambulatory Visit: Payer: Self-pay | Admitting: Podiatry

## 2013-05-14 ENCOUNTER — Telehealth: Payer: Self-pay | Admitting: Family Medicine

## 2013-05-14 DIAGNOSIS — F29 Unspecified psychosis not due to a substance or known physiological condition: Secondary | ICD-10-CM

## 2013-05-14 DIAGNOSIS — F4310 Post-traumatic stress disorder, unspecified: Secondary | ICD-10-CM

## 2013-05-14 DIAGNOSIS — I10 Essential (primary) hypertension: Secondary | ICD-10-CM

## 2013-05-14 DIAGNOSIS — F068 Other specified mental disorders due to known physiological condition: Secondary | ICD-10-CM

## 2013-05-14 NOTE — Telephone Encounter (Signed)
Seen today No sore throat No fever  No urinary symptoms  Discussed this with staff and called niece

## 2013-05-14 NOTE — Telephone Encounter (Signed)
[facility] Triage Call Report Triage Record Num: [id number] Operator: Tomasita Crumble Patient Name: Ricardo Edwards Call Date & Time: 05/13/2013 6:09:05PM Patient Phone: [phone] PCP: Tillman Abide Patient Gender: Male PCP Fax : [phone] Patient DOB: 1921/12/29 Practice Name: [facility] Reason for Call: Caller: Karen/Other; PCP: Tillman Abide (Family Practice); CB#: [phone]; Call regarding Sore Throat. Onset 05/11/13 with scratchy throat. He feels worse and throat pain increasing. Afebrile/tactile. He is in a nursing home and caller asks that he be checked out on 05/14/13 for his symptoms. See Provider within 4 hours per Sore Throat or Hoarseness due to Marked difficulty swallowing due to sore throat unresponsive to 12 hours of home care. Caller also reports patient has been drooling more. Reinforced to take him for evaluation due to reported symptoms. Caller states plan to follow up with nursing facility. Protocol(s) Used: Sore Throat or Hoarseness Recommended Outcome per Protocol: See Provider within 4 hours Reason for Outcome: Marked difficulty swallowing due to sore throat unresponsive to 12 hours of home care Care Advice: ~ 05/13/2013 6:22:49PM Page 1 of 1 [email or web address]

## 2013-05-16 ENCOUNTER — Encounter: Payer: Self-pay | Admitting: Internal Medicine

## 2013-05-18 ENCOUNTER — Telehealth: Payer: Self-pay | Admitting: Internal Medicine

## 2013-05-18 NOTE — Telephone Encounter (Signed)
Phone interview with Royal Piedra from [facility] Department of Health Service Regulation  At [facility] now investigating complaint relating to this patient. Leaving know so I can't meet with her in person Interview done

## 2013-07-29 ENCOUNTER — Inpatient Hospital Stay (HOSPITAL_COMMUNITY)
Admission: EM | Admit: 2013-07-29 | Discharge: 2013-08-01 | DRG: 689 | Disposition: A | Discharge status: Nursing Home (Includes ICF) | Payer: Medicare Other | Attending: Internal Medicine | Admitting: Internal Medicine

## 2013-07-29 ENCOUNTER — Emergency Department (HOSPITAL_COMMUNITY): Payer: Medicare Other

## 2013-07-29 ENCOUNTER — Encounter (HOSPITAL_COMMUNITY): Payer: Self-pay | Admitting: Emergency Medicine

## 2013-07-29 DIAGNOSIS — Z880 Allergy status to penicillin: Secondary | ICD-10-CM

## 2013-07-29 DIAGNOSIS — Z79899 Other long term (current) drug therapy: Secondary | ICD-10-CM

## 2013-07-29 DIAGNOSIS — N179 Acute kidney failure, unspecified: Secondary | ICD-10-CM | POA: Diagnosis present

## 2013-07-29 DIAGNOSIS — F3289 Other specified depressive episodes: Secondary | ICD-10-CM | POA: Diagnosis present

## 2013-07-29 DIAGNOSIS — R1310 Dysphagia, unspecified: Secondary | ICD-10-CM | POA: Diagnosis present

## 2013-07-29 DIAGNOSIS — J301 Allergic rhinitis due to pollen: Secondary | ICD-10-CM | POA: Diagnosis present

## 2013-07-29 DIAGNOSIS — F329 Major depressive disorder, single episode, unspecified: Secondary | ICD-10-CM | POA: Diagnosis present

## 2013-07-29 DIAGNOSIS — F0390 Unspecified dementia without behavioral disturbance: Secondary | ICD-10-CM | POA: Diagnosis present

## 2013-07-29 DIAGNOSIS — N400 Enlarged prostate without lower urinary tract symptoms: Secondary | ICD-10-CM | POA: Diagnosis present

## 2013-07-29 DIAGNOSIS — F4310 Post-traumatic stress disorder, unspecified: Secondary | ICD-10-CM

## 2013-07-29 DIAGNOSIS — K219 Gastro-esophageal reflux disease without esophagitis: Secondary | ICD-10-CM | POA: Diagnosis present

## 2013-07-29 DIAGNOSIS — E785 Hyperlipidemia, unspecified: Secondary | ICD-10-CM | POA: Diagnosis present

## 2013-07-29 DIAGNOSIS — E860 Dehydration: Secondary | ICD-10-CM | POA: Diagnosis present

## 2013-07-29 DIAGNOSIS — Z993 Dependence on wheelchair: Secondary | ICD-10-CM

## 2013-07-29 DIAGNOSIS — R001 Bradycardia, unspecified: Secondary | ICD-10-CM

## 2013-07-29 DIAGNOSIS — W19XXXA Unspecified fall, initial encounter: Secondary | ICD-10-CM | POA: Diagnosis present

## 2013-07-29 DIAGNOSIS — E041 Nontoxic single thyroid nodule: Secondary | ICD-10-CM | POA: Diagnosis present

## 2013-07-29 DIAGNOSIS — M1990 Unspecified osteoarthritis, unspecified site: Secondary | ICD-10-CM | POA: Diagnosis present

## 2013-07-29 DIAGNOSIS — M159 Polyosteoarthritis, unspecified: Secondary | ICD-10-CM

## 2013-07-29 DIAGNOSIS — W050XXA Fall from non-moving wheelchair, initial encounter: Secondary | ICD-10-CM | POA: Diagnosis present

## 2013-07-29 DIAGNOSIS — Z8249 Family history of ischemic heart disease and other diseases of the circulatory system: Secondary | ICD-10-CM

## 2013-07-29 DIAGNOSIS — R634 Abnormal weight loss: Secondary | ICD-10-CM | POA: Diagnosis present

## 2013-07-29 DIAGNOSIS — I10 Essential (primary) hypertension: Secondary | ICD-10-CM | POA: Diagnosis present

## 2013-07-29 DIAGNOSIS — Z66 Do not resuscitate: Secondary | ICD-10-CM | POA: Diagnosis present

## 2013-07-29 DIAGNOSIS — J029 Acute pharyngitis, unspecified: Secondary | ICD-10-CM

## 2013-07-29 DIAGNOSIS — Z888 Allergy status to other drugs, medicaments and biological substances status: Secondary | ICD-10-CM

## 2013-07-29 DIAGNOSIS — H9190 Unspecified hearing loss, unspecified ear: Secondary | ICD-10-CM | POA: Diagnosis present

## 2013-07-29 DIAGNOSIS — G9341 Metabolic encephalopathy: Secondary | ICD-10-CM | POA: Diagnosis present

## 2013-07-29 DIAGNOSIS — F32A Depression, unspecified: Secondary | ICD-10-CM | POA: Diagnosis present

## 2013-07-29 DIAGNOSIS — N390 Urinary tract infection, site not specified: Principal | ICD-10-CM | POA: Diagnosis present

## 2013-07-29 DIAGNOSIS — N320 Bladder-neck obstruction: Secondary | ICD-10-CM | POA: Diagnosis present

## 2013-07-29 LAB — URINALYSIS, ROUTINE W REFLEX MICROSCOPIC
Bilirubin Urine: NEGATIVE
GLUCOSE, UA: NEGATIVE mg/dL
Ketones, ur: NEGATIVE mg/dL
Nitrite: NEGATIVE
Protein, ur: 100 mg/dL — AB
SPECIFIC GRAVITY, URINE: 1.018 (ref 1.005–1.030)
Urobilinogen, UA: 0.2 mg/dL (ref 0.0–1.0)
pH: 6 (ref 5.0–8.0)

## 2013-07-29 LAB — BASIC METABOLIC PANEL
BUN: 53 mg/dL — ABNORMAL HIGH (ref 6–23)
CO2: 23 mEq/L (ref 19–32)
Calcium: 9.1 mg/dL (ref 8.4–10.5)
Chloride: 100 mEq/L (ref 96–112)
Creatinine, Ser: 1.9 mg/dL — ABNORMAL HIGH (ref 0.50–1.35)
GFR calc Af Amer: 34 mL/min — ABNORMAL LOW (ref >90)
GFR calc non Af Amer: 30 mL/min — ABNORMAL LOW (ref >90)
GLUCOSE: 86 mg/dL (ref 70–99)
POTASSIUM: 4.9 meq/L (ref 3.7–5.3)
SODIUM: 138 meq/L (ref 137–147)

## 2013-07-29 LAB — CBC WITH DIFFERENTIAL/PLATELET
Basophils Absolute: 0 10*3/uL (ref 0.0–0.1)
Basophils Relative: 0 % (ref 0–1)
Eosinophils Absolute: 0.3 10*3/uL (ref 0.0–0.7)
Eosinophils Relative: 3 % (ref 0–5)
HCT: 38.9 % — ABNORMAL LOW (ref 39.0–52.0)
HEMOGLOBIN: 12.6 g/dL — AB (ref 13.0–17.0)
LYMPHS PCT: 15 % (ref 12–46)
Lymphs Abs: 1.5 10*3/uL (ref 0.7–4.0)
MCH: 28.9 pg (ref 26.0–34.0)
MCHC: 32.4 g/dL (ref 30.0–36.0)
MCV: 89.2 fL (ref 78.0–100.0)
MONOS PCT: 8 % (ref 3–12)
Monocytes Absolute: 0.8 10*3/uL (ref 0.1–1.0)
NEUTROS PCT: 74 % (ref 43–77)
Neutro Abs: 7.7 10*3/uL (ref 1.7–7.7)
Platelets: 261 10*3/uL (ref 150–400)
RBC: 4.36 MIL/uL (ref 4.22–5.81)
RDW: 14.7 % (ref 11.5–15.5)
WBC: 10.4 10*3/uL (ref 4.0–10.5)

## 2013-07-29 LAB — POCT I-STAT TROPONIN I: Troponin i, poc: 0.04 ng/mL (ref 0.00–0.08)

## 2013-07-29 LAB — URINE MICROSCOPIC-ADD ON

## 2013-07-29 MED ORDER — CEFTRIAXONE SODIUM 1 G IJ SOLR
1.0000 g | Freq: Once | INTRAMUSCULAR | Status: AC
  Administered 2013-07-29: 1 g via INTRAVENOUS
  Filled 2013-07-29: qty 10

## 2013-07-29 MED ORDER — SODIUM CHLORIDE 0.9 % IV SOLN
INTRAVENOUS | Status: DC
  Administered 2013-07-30: 08:00:00 via INTRAVENOUS

## 2013-07-29 MED ORDER — SODIUM CHLORIDE 0.9 % IV BOLUS (SEPSIS)
1000.0000 mL | Freq: Once | INTRAVENOUS | Status: AC
  Administered 2013-07-29: 1000 mL via INTRAVENOUS

## 2013-07-29 MED ORDER — DEXTROSE 5 % IV SOLN
1.0000 g | INTRAVENOUS | Status: DC
  Administered 2013-07-30 – 2013-07-31 (×2): 1 g via INTRAVENOUS
  Filled 2013-07-29 (×3): qty 10

## 2013-07-29 MED ORDER — CEFTRIAXONE SODIUM 1 G IJ SOLR: 1.0000 g | Freq: Once | INTRAMUSCULAR | Status: DC

## 2013-07-29 MED ORDER — BOOST PLUS PO LIQD
237.0000 mL | Freq: Three times a day (TID) | ORAL | Status: DC
  Administered 2013-07-30 – 2013-07-31 (×5): 237 mL via ORAL
  Filled 2013-07-29 (×9): qty 237

## 2013-07-29 MED ORDER — TAMSULOSIN HCL 0.4 MG PO CAPS
0.4000 mg | ORAL_CAPSULE | Freq: Every day | ORAL | Status: DC
  Administered 2013-07-29 – 2013-08-01 (×3): 0.4 mg via ORAL
  Filled 2013-07-29 (×4): qty 1

## 2013-07-29 NOTE — H&P (Signed)
Patient Demographics  Ricardo Edwards, is a 80 y.o. male  MRN: [id number]   DOB - 1922/07/03  Admit Date - 07/29/2013  Outpatient Primary MD for the patient is Tillman Abide, MD   With History of -  Past Medical History  Diagnosis Date  . Hearing loss   . Dementia 2013  . PTSD (post-traumatic stress disorder)   . Hyperlipidemia   . GERD (gastroesophageal reflux disease)   . Allergic rhinitis due to pollen   . Osteoarthrosis involving, or with mention of more than one site, but not specified as generalized, multiple sites   . HTN (hypertension), benign   . Depression   . BPH (benign prostatic hypertrophy)       Past Surgical History  Procedure Laterality Date  . Appendectomy    . Ear lobe    . Cataract extraction    . Nose surgery    . Tonsillectomy      in for   Chief Complaint  Patient presents with  . Fall     HPI  Ricardo Edwards  is a 80 y.o. male, dementia, bilateral hearing loss, hypertension, BPH, GERD, PTSD, dyslipidemia who lives in a memory care unit, has been experiencing ongoing generalized weakness and poor appetite for the last 3-4 months, has become quite weak and according to the niece who is the power of attorney he has been little he wheelchair-bound for the last 1 month, has not been eating or drinking well and has lost about 22 pounds, today he was sitting in his wheelchair and slumped forwards and sustained a fall, he was then brought to the ER where he had an extensive workup of imaging revealing and L-spine compression deformity of unclear date, according to the daughter he's been having low back pain for several months after her previous fall, he also had head and neck CT scan which was unremarkable except for a thyroid nodule , his lab work was suggestive of UTI, acute renal failure,  he also had metabolic encephalopathy I was called to admit the patient .   He currently is quite delirious and unable to provide good history however he denies any headache or chest pain, no abdominal pain, denies any back pain, does agree to generalized weakness but no focal weakness in any extremity.    Review of Systems    Unreliable historian due to dementia and delirium, limited review of systems as in history of present illness above.   Social History History  Substance Use Topics  . Smoking status: Never Smoker   . Smokeless tobacco: Never Used  . Alcohol Use: No      Family History Family History  Problem Relation Age of Onset  . Diabetes Father   . Hypertension Father   . Gout      NOT KNOWN      Prior to Admission medications   Medication Sig Start Date End Date Taking? Authorizing Provider  acetaminophen (TYLENOL) 500  MG tablet Take 1,000 mg by mouth 3 (three) times daily as needed for pain.    Yes Historical Provider, MD  amLODipine (NORVASC) 10 MG tablet Take 10 mg by mouth at bedtime.    Yes Historical Provider, MD  LORazepam (ATIVAN) 0.5 MG tablet Take 0.25-0.5 mg by mouth See admin instructions. Take 1/2 tab once daily at 1pm   Yes Historical Provider, MD  LORazepam (ATIVAN) 0.5 MG tablet Take 0.5 mg by mouth every 4 (four) hours as needed for anxiety.   Yes Historical Provider, MD  Melatonin 1 MG TABS Take 1 tablet by mouth at bedtime as needed (sleep).   Yes Historical Provider, MD  memantine (NAMENDA) 10 MG tablet Take 10 mg by mouth 2 (two) times daily.   Yes Historical Provider, MD  mirtazapine (REMERON) 30 MG tablet Take 30 mg by mouth at bedtime.   Yes Historical Provider, MD  multivitamin-lutein (OCUVITE-LUTEIN) CAPS Take 1 capsule by mouth 2 (two) times daily.   Yes Historical Provider, MD  polyethylene glycol (MIRALAX / GLYCOLAX) packet Take 17 g by mouth daily as needed for mild constipation.   Yes Historical Provider, MD  potassium chloride SA  (K-DUR,KLOR-CON) 20 MEQ tablet TAKE 2 TABLETS BY MOUTH EACH MORNING AND AT LUNCH FOR POTASSIUM SUPPLEMENT. MAY DISSOLVE/DO NOT CRUSH. 02/01/13  Yes Karie Schwalbe, MD  sulfamethoxazole-trimethoprim (BACTRIM,SEPTRA) 400-80 MG per tablet Take 1 tablet by mouth 2 (two) times daily. For 7 days   Yes Historical Provider, MD  divalproex (DEPAKOTE) 250 MG DR tablet Take 250 mg by mouth daily. For 7 days then stop    Historical Provider, MD    Allergies  Allergen Reactions  . Metoprolol     Just listed as "beta blockers"  . Penicillins     Physical Exam  Vitals  Blood pressure 135/48, pulse 67, temperature 98.5 F (36.9 C), temperature source Rectal, resp. rate 18, SpO2 97.00%.   1. General frail elderly white male lying in bed in NAD,     2. Normal affect and insight, Not Suicidal or Homicidal, Awake but confused, oriented x 0  3. No F.N deficits, ALL C.Nerves Intact, Strength 5/5 all 4 extremities, Sensation intact all 4 extremities, Plantars down going.  4. Ears and Eyes appear Normal, Conjunctivae clear, PERRLA. Moist Oral Mucosa.  5. Supple Neck, No JVD, No cervical lymphadenopathy appriciated, No Carotid Bruits.  6. Symmetrical Chest wall movement, Good air movement bilaterally, CTAB.  7. RRR, No Gallops, Rubs or Murmurs, No Parasternal Heave.  8. Positive Bowel Sounds, Abdomen Soft, Non tender, some suprapubic fullness, No organomegaly appriciated,No rebound -guarding or rigidity.  9.  No Cyanosis, Normal Skin Turgor, No Skin Rash or Bruise.  10. Good muscle tone,  joints appear normal , no effusions, Normal ROM.  11. No Palpable Lymph Nodes in Neck or Axillae     Data Review  CBC  Recent Labs Lab 07/29/13 1940  WBC 10.4  HGB 12.6*  HCT 38.9*  PLT 261  MCV 89.2  MCH 28.9  MCHC 32.4  RDW 14.7  LYMPHSABS 1.5  MONOABS 0.8  EOSABS 0.3  BASOSABS 0.0    ------------------------------------------------------------------------------------------------------------------  Chemistries   Recent Labs Lab 07/29/13 1940  NA 138  K 4.9  CL 100  CO2 23  GLUCOSE 86  BUN 53*  CREATININE 1.90*  CALCIUM 9.1   ------------------------------------------------------------------------------------------------------------------ CrCl is unknown because both a height and weight (above a minimum accepted value) are required for this calculation. ------------------------------------------------------------------------------------------------------------------ No results found for  this basename: TSH, T4TOTAL, FREET3, T3FREE, THYROIDAB,  in the last 72 hours   Coagulation profile No results found for this basename: INR, PROTIME,  in the last 168 hours ------------------------------------------------------------------------------------------------------------------- No results found for this basename: DDIMER,  in the last 72 hours -------------------------------------------------------------------------------------------------------------------  Cardiac Enzymes No results found for this basename: CK, CKMB, TROPONINI, MYOGLOBIN,  in the last 168 hours ------------------------------------------------------------------------------------------------------------------ No components found with this basename: POCBNP,    ---------------------------------------------------------------------------------------------------------------  Urinalysis    Component Value Date/Time   COLORURINE YELLOW 07/29/2013 1948   APPEARANCEUR TURBID* 07/29/2013 1948   LABSPEC 1.018 07/29/2013 1948   PHURINE 6.0 07/29/2013 1948   GLUCOSEU NEGATIVE 07/29/2013 1948   HGBUR MODERATE* 07/29/2013 1948   BILIRUBINUR NEGATIVE 07/29/2013 1948   KETONESUR NEGATIVE 07/29/2013 1948   PROTEINUR 100* 07/29/2013 1948   UROBILINOGEN 0.2 07/29/2013 1948   NITRITE NEGATIVE 07/29/2013 1948    LEUKOCYTESUR LARGE* 07/29/2013 1948    ----------------------------------------------------------------------------------------------------------------  Imaging results:   Dg Chest 1 View  07/29/2013   CLINICAL DATA:  Fall.  Back pain.  EXAM: CHEST - 1 VIEW  COMPARISON:  None.  FINDINGS: 2004 hr. There are low lung volumes. The heart size and mediastinal contours are normal aside from aortic atherosclerosis. There is no evidence of mediastinal hematoma. There is no confluent airspace opacity, pleural effusion or pneumothorax. No fractures are identified.  IMPRESSION: Low lung volumes.  No evidence of acute chest injury.   Electronically Signed   By: Roxy HorsemanBill  Veazey M.D.   On: 07/29/2013 20:37   Dg Lumbar Spine Complete  07/29/2013   CLINICAL DATA:  Trauma and pain.  EXAM: LUMBAR SPINE - COMPLETE 4+ VIEW  COMPARISON:  None.  FINDINGS: Five lumbar type vertebral bodies. Minimal S-shaped lumbar spine curvature. Aortic atherosclerosis. Sacroiliac joints are symmetric. Probable phleboliths in the pelvis. A mild L1 compression deformity, without significant canal compromise. Expected for age multilevel spondylosis.  IMPRESSION: Mild L1 compression deformity, of indeterminate acuity.  Aortic atherosclerosis.   Electronically Signed   By: Jeronimo GreavesKyle  Talbot M.D.   On: 07/29/2013 20:38   Dg Sacrum/coccyx  07/29/2013   CLINICAL DATA:  Trauma and pain.  EXAM: SACRUM AND COCCYX - 2+ VIEW  COMPARISON:  DG LUMBAR SPINE COMPLETE dated 07/29/2013  FINDINGS: Femoral heads are located. Sacroiliac joints are symmetric. Mild degenerative irregularity about the inferior coccyx. Degenerative disc disease at L4-5.  IMPRESSION: No acute osseous abnormality.   Electronically Signed   By: Jeronimo GreavesKyle  Talbot M.D.   On: 07/29/2013 20:39   Ct Head Wo Contrast  07/29/2013   CLINICAL DATA:  Fall from wheelchair.  Dementia.  EXAM: CT HEAD WITHOUT CONTRAST  CT CERVICAL SPINE WITHOUT CONTRAST  TECHNIQUE: Multidetector CT imaging of the head and  cervical spine was performed following the standard protocol without intravenous contrast. Multiplanar CT image reconstructions of the cervical spine were also generated.  COMPARISON:  None.  FINDINGS: CT HEAD FINDINGS  There is no evidence of acute intracranial hemorrhage, mass lesion, brain edema or extra-axial fluid collection. The ventricles and subarachnoid spaces are diffusely prominent consistent with moderate atrophy. There is no CT evidence of acute cortical infarction. Mild periventricular white matter disease is present.  The visualized paranasal sinuses, mastoid air cells and middle ears are clear. The calvarium is intact.  CT CERVICAL SPINE FINDINGS  There is a convex right scoliosis with mild straightening. There is multilevel spondylosis with facet hypertrophy and ankylosis, especially on the left from C4 through C6. There is no evidence of  acute fracture or traumatic subluxation.  No acute soft tissue findings are identified. There is diffuse atherosclerosis of the carotid arteries. There is a knee left thyroid nodule measuring 1.8 cm on image 76.  IMPRESSION: 1. No acute intracranial or calvarial findings. 2. Moderate atrophy. 3. No evidence of acute cervical spine fracture, traumatic subluxation or static signs of instability. There is multilevel cervical spondylosis with facet ankylosis. 4. Left thyroid nodule. This meets size criteria for additional assessment with ultrasound. However, given his age, that may not be necessary depending on the patient's associated comorbidities.   Electronically Signed   By: Roxy Horseman M.D.   On: 07/29/2013 20:15   Ct Cervical Spine Wo Contrast  07/29/2013   CLINICAL DATA:  Fall from wheelchair.  Dementia.  EXAM: CT HEAD WITHOUT CONTRAST  CT CERVICAL SPINE WITHOUT CONTRAST  TECHNIQUE: Multidetector CT imaging of the head and cervical spine was performed following the standard protocol without intravenous contrast. Multiplanar CT image reconstructions of the  cervical spine were also generated.  COMPARISON:  None.  FINDINGS: CT HEAD FINDINGS  There is no evidence of acute intracranial hemorrhage, mass lesion, brain edema or extra-axial fluid collection. The ventricles and subarachnoid spaces are diffusely prominent consistent with moderate atrophy. There is no CT evidence of acute cortical infarction. Mild periventricular white matter disease is present.  The visualized paranasal sinuses, mastoid air cells and middle ears are clear. The calvarium is intact.  CT CERVICAL SPINE FINDINGS  There is a convex right scoliosis with mild straightening. There is multilevel spondylosis with facet hypertrophy and ankylosis, especially on the left from C4 through C6. There is no evidence of acute fracture or traumatic subluxation.  No acute soft tissue findings are identified. There is diffuse atherosclerosis of the carotid arteries. There is a knee left thyroid nodule measuring 1.8 cm on image 76.  IMPRESSION: 1. No acute intracranial or calvarial findings. 2. Moderate atrophy. 3. No evidence of acute cervical spine fracture, traumatic subluxation or static signs of instability. There is multilevel cervical spondylosis with facet ankylosis. 4. Left thyroid nodule. This meets size criteria for additional assessment with ultrasound. However, given his age, that may not be necessary depending on the patient's associated comorbidities.   Electronically Signed   By: Roxy Horseman M.D.   On: 07/29/2013 20:15    My personal review of EKG: Rhythm NSR, old bifascicular block, no Acute ST changes    Assessment & Plan   1. Metabolic encephalopathy due to combination of UTI and acute renal failure. Will be admitted to MedSurg bed, IV fluids, IV antibiotic, follow urine culture, avoid benzodiazepines, he remains at risk for delirium fall and aspiration. Niece power of attorney has been informed.    2. Acute renal failure. Baseline creatinine in December of 2013 was 1, a bladder scan  him bedside and he has about 600 cc of urine, at this time this appears to be a combination of dehydration and bladder outlet obstruction. We'll place a Foley catheter, Flomax, IV fluids, obtain urine electrolytes, monitor BMP.   3. Hypertension. Continue home dose Norvasc.    4. History of dementia and PTSD. For now we'll continue on Depakote, Namenda, low dose Ativan once a night, and monitor.    5. Incidental thyroid nodule noted on CT scan. Not a candidate for thyroid biopsy chemotherapy or radiation, will check baseline TSH then outpatient followup by PCP.    6. History of dysphagia. For now we'll place on dysphagia 3 diet with nectar  thick liquids, aspiration precautions and feeding assistance. Speech to evaluate in the morning.    7. Generalized weakness-poor appetite-20 pound weight loss over the last 3-4 months. Could be due to #5 above or could be terminal decline due to advanced age, protein supplementation, PT eval, check TSH and then supportive care. Not a candidate for extensive workup.     DVT Prophylaxis Heparin   AM Labs Ordered, also please review Full Orders  Family Communication: Admission, patients condition and plan of care including tests being ordered have been discussed with the patient and neice who indicate understanding and agree with the plan and Code Status.  Code Status DNR  Likely DC to  SNF  Condition GUARDED   Time spent in minutes : 35    Janeya Deyo K M.D on 07/29/2013 at 10:33 PM  Between 7am to 7pm - Pager - [phone]  After 7pm go to [email or web address] - password [email or web address]  And look for the night coverage person covering me after hours  [facility] Office  [phone]

## 2013-07-29 NOTE — ED Provider Notes (Signed)
CSN: [id number]     Arrival date & time 07/29/13  1826 History   First MD Initiated Contact with Patient 07/29/13 1901     Chief Complaint  Patient presents with  . Fall   (Consider location/radiation/quality/duration/timing/severity/associated sxs/prior Treatment) HPI Comments: Patient is an 80 year old male with history of hyperlipidemia, hypertension, BPH, dementia who presents today after a fall. He is currently living at [facility]. The fall was unwitnessed. His niece is in the room now. She reports that he generally falls asleep in his chair after becoming fatigued from wheeling around. She is concerned he slumped forward and hit his head. His nephew believes that he may have fallen and hit his tailbone. He was saying "something is broken". The niece reports that he is generally oriented to person, place. His mental status has been declining over the past few weeks. He evaluated by his primary care physician and placed on Bactrim. They were unable to get a urine sample from him and just presumptively treated a urinary tract infection. He has had no cough, fever. His niece reports that he has become gradually more weak over the past few weeks. He has stopped eating.  The history is provided by a caregiver. No language interpreter was used.    Past Medical History  Diagnosis Date  . Hearing loss   . Dementia 2013  . PTSD (post-traumatic stress disorder)   . Hyperlipidemia   . GERD (gastroesophageal reflux disease)   . Allergic rhinitis due to pollen   . Osteoarthrosis involving, or with mention of more than one site, but not specified as generalized, multiple sites   . HTN (hypertension), benign   . Depression   . BPH (benign prostatic hypertrophy)    Past Surgical History  Procedure Laterality Date  . Appendectomy    . Ear lobe    . Cataract extraction    . Nose surgery    . Tonsillectomy     Family History  Problem Relation Age of Onset  . Diabetes Father    . Hypertension Father   . Gout      NOT KNOWN   History  Substance Use Topics  . Smoking status: Never Smoker   . Smokeless tobacco: Never Used  . Alcohol Use: No    Review of Systems  Unable to perform ROS: Other  Constitutional: Negative for fever and chills.  Respiratory: Negative for shortness of breath.   Cardiovascular: Negative for chest pain.  Gastrointestinal: Negative for nausea, vomiting, abdominal pain and diarrhea.    Allergies  Metoprolol and Penicillins  Home Medications   Current Outpatient Rx  Name  Route  Sig  Dispense  Refill  . acetaminophen (TYLENOL) 500 MG tablet   Oral   Take 1,000 mg by mouth 3 (three) times daily as needed for pain.          Marland Kitchen amLODipine (NORVASC) 10 MG tablet   Oral   Take 10 mg by mouth daily.          . ASPIRIN LOW DOSE 81 MG EC tablet      TAKE ONE TABLET PO DAILY.    ** DO NOT CRUSH. ** ANTI-PLATELET AGGREGATION   30 tablet   11   . diclofenac sodium (VOLTAREN) 1 % GEL   Topical   Apply 2 g topically 2 (two) times daily. For back         . famotidine (PEPCID) 20 MG tablet      TAKE ONE TABLET BY  MOUTH ONCE TWICE DAILY. (GERD/REFLUX/STOMACH)   60 tablet   11   . fluticasone (FLONASE) 50 MCG/ACT nasal spray   Nasal   Place 2 sprays into the nose daily.         Marland Kitchen lisinopril-hydrochlorothiazide (PRINZIDE,ZESTORETIC) 20-25 MG per tablet   Oral   Take 1 tablet by mouth daily.         . Memantine HCl ER (NAMENDA XR) 28 MG CP24   Oral   Take 1 capsule by mouth daily.         . metroNIDAZOLE (METROGEL) 1 % gel   Topical   Apply 1 application topically daily.         . multivitamin-lutein (OCUVITE-LUTEIN) CAPS   Oral   Take 1 capsule by mouth 2 (two) times daily.         Marland Kitchen PARoxetine (PAXIL) 10 MG tablet   Oral   Take 20 mg by mouth daily.          . potassium chloride SA (K-DUR,KLOR-CON) 20 MEQ tablet      TAKE 2 TABLETS BY MOUTH EACH MORNING AND AT LUNCH FOR POTASSIUM SUPPLEMENT.  MAY DISSOLVE/DO NOT CRUSH.   120 tablet   11   . tamsulosin (FLOMAX) 0.4 MG CAPS capsule   Oral   Take 0.4 mg by mouth.          BP 135/48  Pulse 67  Temp(Src) 97.6 F (36.4 C) (Oral)  Resp 18  SpO2 97% Physical Exam  Nursing note and vitals reviewed. Constitutional: He is oriented to person, place, and time. He appears well-developed and well-nourished. No distress.  Frail  HENT:  Head: Normocephalic and atraumatic.  Right Ear: External ear normal.  Left Ear: External ear normal.  Nose: Nose normal.  Eyes: Conjunctivae are normal. Pupils are equal, round, and reactive to light.  Neck: Normal range of motion. No tracheal deviation present.  Cardiovascular: Normal rate, regular rhythm, normal heart sounds, intact distal pulses and normal pulses.   Pulmonary/Chest: Effort normal and breath sounds normal. No stridor.  Abdominal: Soft. He exhibits no distension. There is no tenderness.  Musculoskeletal: Normal range of motion.  No tenderness to palpation appreciated on exam. Logroll test negative bilaterally.  Neurological: He is alert and oriented to person, place, and time.  Skin: Skin is warm and dry. He is not diaphoretic.  Psychiatric: He has a normal mood and affect. His behavior is normal.    ED Course  Procedures (including critical care time) Labs Review Labs Reviewed  CBC WITH DIFFERENTIAL - Abnormal; Notable for the following:    Hemoglobin 12.6 (*)    HCT 38.9 (*)    All other components within normal limits  BASIC METABOLIC PANEL - Abnormal; Notable for the following:    BUN 53 (*)    Creatinine, Ser 1.90 (*)    GFR calc non Af Amer 30 (*)    GFR calc Af Amer 34 (*)    All other components within normal limits  URINALYSIS, ROUTINE W REFLEX MICROSCOPIC - Abnormal; Notable for the following:    APPearance TURBID (*)    Hgb urine dipstick MODERATE (*)    Protein, ur 100 (*)    Leukocytes, UA LARGE (*)    All other components within normal limits  URINE  MICROSCOPIC-ADD ON - Abnormal; Notable for the following:    Bacteria, UA MANY (*)    All other components within normal limits  URINE CULTURE   Imaging Review Dg Chest  1 View  07/29/2013   CLINICAL DATA:  Fall.  Back pain.  EXAM: CHEST - 1 VIEW  COMPARISON:  None.  FINDINGS: 2004 hr. There are low lung volumes. The heart size and mediastinal contours are normal aside from aortic atherosclerosis. There is no evidence of mediastinal hematoma. There is no confluent airspace opacity, pleural effusion or pneumothorax. No fractures are identified.  IMPRESSION: Low lung volumes.  No evidence of acute chest injury.   Electronically Signed   By: Roxy HorsemanBill  Veazey M.D.   On: 07/29/2013 20:37   Dg Lumbar Spine Complete  07/29/2013   CLINICAL DATA:  Trauma and pain.  EXAM: LUMBAR SPINE - COMPLETE 4+ VIEW  COMPARISON:  None.  FINDINGS: Five lumbar type vertebral bodies. Minimal S-shaped lumbar spine curvature. Aortic atherosclerosis. Sacroiliac joints are symmetric. Probable phleboliths in the pelvis. A mild L1 compression deformity, without significant canal compromise. Expected for age multilevel spondylosis.  IMPRESSION: Mild L1 compression deformity, of indeterminate acuity.  Aortic atherosclerosis.   Electronically Signed   By: Jeronimo GreavesKyle  Talbot M.D.   On: 07/29/2013 20:38   Dg Sacrum/coccyx  07/29/2013   CLINICAL DATA:  Trauma and pain.  EXAM: SACRUM AND COCCYX - 2+ VIEW  COMPARISON:  DG LUMBAR SPINE COMPLETE dated 07/29/2013  FINDINGS: Femoral heads are located. Sacroiliac joints are symmetric. Mild degenerative irregularity about the inferior coccyx. Degenerative disc disease at L4-5.  IMPRESSION: No acute osseous abnormality.   Electronically Signed   By: Jeronimo GreavesKyle  Talbot M.D.   On: 07/29/2013 20:39   Ct Head Wo Contrast  07/29/2013   CLINICAL DATA:  Fall from wheelchair.  Dementia.  EXAM: CT HEAD WITHOUT CONTRAST  CT CERVICAL SPINE WITHOUT CONTRAST  TECHNIQUE: Multidetector CT imaging of the head and cervical spine  was performed following the standard protocol without intravenous contrast. Multiplanar CT image reconstructions of the cervical spine were also generated.  COMPARISON:  None.  FINDINGS: CT HEAD FINDINGS  There is no evidence of acute intracranial hemorrhage, mass lesion, brain edema or extra-axial fluid collection. The ventricles and subarachnoid spaces are diffusely prominent consistent with moderate atrophy. There is no CT evidence of acute cortical infarction. Mild periventricular white matter disease is present.  The visualized paranasal sinuses, mastoid air cells and middle ears are clear. The calvarium is intact.  CT CERVICAL SPINE FINDINGS  There is a convex right scoliosis with mild straightening. There is multilevel spondylosis with facet hypertrophy and ankylosis, especially on the left from C4 through C6. There is no evidence of acute fracture or traumatic subluxation.  No acute soft tissue findings are identified. There is diffuse atherosclerosis of the carotid arteries. There is a knee left thyroid nodule measuring 1.8 cm on image 76.  IMPRESSION: 1. No acute intracranial or calvarial findings. 2. Moderate atrophy. 3. No evidence of acute cervical spine fracture, traumatic subluxation or static signs of instability. There is multilevel cervical spondylosis with facet ankylosis. 4. Left thyroid nodule. This meets size criteria for additional assessment with ultrasound. However, given his age, that may not be necessary depending on the patient's associated comorbidities.   Electronically Signed   By: Roxy HorsemanBill  Veazey M.D.   On: 07/29/2013 20:15   Ct Cervical Spine Wo Contrast  07/29/2013   CLINICAL DATA:  Fall from wheelchair.  Dementia.  EXAM: CT HEAD WITHOUT CONTRAST  CT CERVICAL SPINE WITHOUT CONTRAST  TECHNIQUE: Multidetector CT imaging of the head and cervical spine was performed following the standard protocol without intravenous contrast. Multiplanar CT image reconstructions of  the cervical spine  were also generated.  COMPARISON:  None.  FINDINGS: CT HEAD FINDINGS  There is no evidence of acute intracranial hemorrhage, mass lesion, brain edema or extra-axial fluid collection. The ventricles and subarachnoid spaces are diffusely prominent consistent with moderate atrophy. There is no CT evidence of acute cortical infarction. Mild periventricular white matter disease is present.  The visualized paranasal sinuses, mastoid air cells and middle ears are clear. The calvarium is intact.  CT CERVICAL SPINE FINDINGS  There is a convex right scoliosis with mild straightening. There is multilevel spondylosis with facet hypertrophy and ankylosis, especially on the left from C4 through C6. There is no evidence of acute fracture or traumatic subluxation.  No acute soft tissue findings are identified. There is diffuse atherosclerosis of the carotid arteries. There is a knee left thyroid nodule measuring 1.8 cm on image 76.  IMPRESSION: 1. No acute intracranial or calvarial findings. 2. Moderate atrophy. 3. No evidence of acute cervical spine fracture, traumatic subluxation or static signs of instability. There is multilevel cervical spondylosis with facet ankylosis. 4. Left thyroid nodule. This meets size criteria for additional assessment with ultrasound. However, given his age, that may not be necessary depending on the patient's associated comorbidities.   Electronically Signed   By: Roxy Horseman M.D.   On: 07/29/2013 20:15    EKG Interpretation    Date/Time:  Sunday July 29 2013 22:06:58 EST Ventricular Rate:  69 PR Interval:  35 QRS Duration: 146 QT Interval:  459 QTC Calculation: 492 R Axis:   -95 Text Interpretation:  Sinus rhythm Short PR interval Left atrial enlargement RBBB and LAFB Anterolateral infarct, age indeterminate No old tracing to compare Confirmed by GOLDSTON  MD, SCOTT ([id number]) on 07/29/2013 10:11:36 PM            MDM   1. UTI (lower urinary tract infection)   2. BPH (benign  prostatic hypertrophy)   3. Dementia   4. Fall   5. GERD (gastroesophageal reflux disease)   6. Hyperlipidemia    Pt presents to ED today after a fall. Worsening mental status x 2-3 weeks. Pt failed outpatient Bactrim for UTI. Started on rocephin in ED. Will admit for further management. ED results have been faxed to pt's PCP per family request. Pt is hemodynamically stable at this time. Dr. Criss Alvine evaluated patient and agrees with plan. Patient / Family / Caregiver informed of clinical course, understand medical decision-making process, and agree with plan.     Mora Bellman, PA-C 07/30/13 1250

## 2013-07-29 NOTE — ED Notes (Signed)
Bed: WA12 Expected date:  Expected time:  Means of arrival:  Comments: EMS 

## 2013-07-29 NOTE — ED Notes (Signed)
Per EMS[facility] Staff states they heard pt fall out of wheelchair, landing on his tailbone. Staff deny pt lost consciousness or hit head. Pt has hx of dementia, staff state pt's behavior is normal for him. Pt appears to be uncomfortable laying flat on his back.

## 2013-07-29 NOTE — ED Notes (Signed)
Dr. Marcelino DusterMichelle Haber's fax [phone]

## 2013-07-29 NOTE — ED Notes (Signed)
Pt's doctor Herschel SenegalMichele Haber cell: [phone]

## 2013-07-30 DIAGNOSIS — G9341 Metabolic encephalopathy: Secondary | ICD-10-CM

## 2013-07-30 LAB — TSH: TSH: 0.315 u[IU]/mL — AB (ref 0.350–4.500)

## 2013-07-30 LAB — CBC
HEMATOCRIT: 33.9 % — AB (ref 39.0–52.0)
Hemoglobin: 10.8 g/dL — ABNORMAL LOW (ref 13.0–17.0)
MCH: 28.3 pg (ref 26.0–34.0)
MCHC: 31.9 g/dL (ref 30.0–36.0)
MCV: 89 fL (ref 78.0–100.0)
Platelets: 197 10*3/uL (ref 150–400)
RBC: 3.81 MIL/uL — ABNORMAL LOW (ref 4.22–5.81)
RDW: 15 % (ref 11.5–15.5)
WBC: 7.1 10*3/uL (ref 4.0–10.5)

## 2013-07-30 LAB — BASIC METABOLIC PANEL
BUN: 46 mg/dL — ABNORMAL HIGH (ref 6–23)
CO2: 20 meq/L (ref 19–32)
Calcium: 8.3 mg/dL — ABNORMAL LOW (ref 8.4–10.5)
Chloride: 104 mEq/L (ref 96–112)
Creatinine, Ser: 1.51 mg/dL — ABNORMAL HIGH (ref 0.50–1.35)
GFR calc Af Amer: 45 mL/min — ABNORMAL LOW (ref >90)
GFR calc non Af Amer: 39 mL/min — ABNORMAL LOW (ref >90)
GLUCOSE: 86 mg/dL (ref 70–99)
Potassium: 4.1 mEq/L (ref 3.7–5.3)
Sodium: 139 mEq/L (ref 137–147)

## 2013-07-30 LAB — T4, FREE: FREE T4: 0.9 ng/dL (ref 0.80–1.80)

## 2013-07-30 LAB — CREATININE, URINE, RANDOM: CREATININE, URINE: 94.9 mg/dL

## 2013-07-30 LAB — OSMOLALITY: Osmolality: 298 mOsm/kg (ref 275–300)

## 2013-07-30 LAB — T3, FREE: T3, Free: 2.5 pg/mL (ref 2.3–4.2)

## 2013-07-30 LAB — OSMOLALITY, URINE: OSMOLALITY UR: 623 mosm/kg (ref 390–1090)

## 2013-07-30 LAB — SODIUM, URINE, RANDOM: SODIUM UR: 59 meq/L

## 2013-07-30 MED ORDER — POLYETHYLENE GLYCOL 3350 17 G PO PACK
17.0000 g | PACK | Freq: Every day | ORAL | Status: DC | PRN
  Filled 2013-07-30: qty 1

## 2013-07-30 MED ORDER — SODIUM CHLORIDE 0.9 % IV BOLUS (SEPSIS)
250.0000 mL | Freq: Once | INTRAVENOUS | Status: AC
  Administered 2013-07-30: 250 mL via INTRAVENOUS

## 2013-07-30 MED ORDER — ONDANSETRON HCL 4 MG/2ML IJ SOLN: 4.0000 mg | Freq: Four times a day (QID) | INTRAMUSCULAR | Status: DC | PRN

## 2013-07-30 MED ORDER — ALUM & MAG HYDROXIDE-SIMETH 200-200-20 MG/5ML PO SUSP: 30.0000 mL | Freq: Four times a day (QID) | ORAL | Status: DC | PRN

## 2013-07-30 MED ORDER — RESOURCE THICKENUP CLEAR PO POWD
ORAL | Status: DC | PRN
  Filled 2013-07-30: qty 125

## 2013-07-30 MED ORDER — SENNOSIDES-DOCUSATE SODIUM 8.6-50 MG PO TABS: 1.0000 | ORAL_TABLET | Freq: Every evening | ORAL | Status: DC | PRN

## 2013-07-30 MED ORDER — HEPARIN SODIUM (PORCINE) 5000 UNIT/ML IJ SOLN
5000.0000 [IU] | Freq: Three times a day (TID) | INTRAMUSCULAR | Status: DC
  Administered 2013-07-30 – 2013-08-01 (×6): 5000 [IU] via SUBCUTANEOUS
  Filled 2013-07-30 (×10): qty 1

## 2013-07-30 MED ORDER — ACETAMINOPHEN 650 MG RE SUPP: 650.0000 mg | Freq: Four times a day (QID) | RECTAL | Status: DC | PRN

## 2013-07-30 MED ORDER — AMLODIPINE BESYLATE 10 MG PO TABS
10.0000 mg | ORAL_TABLET | Freq: Every day | ORAL | Status: DC
  Filled 2013-07-30: qty 1

## 2013-07-30 MED ORDER — ONDANSETRON HCL 4 MG PO TABS: 4.0000 mg | ORAL_TABLET | Freq: Four times a day (QID) | ORAL | Status: DC | PRN

## 2013-07-30 MED ORDER — LORAZEPAM 0.5 MG PO TABS
0.5000 mg | ORAL_TABLET | Freq: Every day | ORAL | Status: DC
  Administered 2013-07-30 – 2013-07-31 (×2): 0.5 mg via ORAL
  Filled 2013-07-30 (×2): qty 1

## 2013-07-30 MED ORDER — DIVALPROEX SODIUM 250 MG PO DR TAB
250.0000 mg | DELAYED_RELEASE_TABLET | Freq: Every day | ORAL | Status: DC
  Administered 2013-07-31 – 2013-08-01 (×2): 250 mg via ORAL
  Filled 2013-07-30 (×3): qty 1

## 2013-07-30 MED ORDER — ACETAMINOPHEN 325 MG PO TABS
650.0000 mg | ORAL_TABLET | Freq: Four times a day (QID) | ORAL | Status: DC | PRN
  Administered 2013-07-30 (×2): 650 mg via ORAL
  Filled 2013-07-30 (×3): qty 2

## 2013-07-30 MED ORDER — GUAIFENESIN-DM 100-10 MG/5ML PO SYRP: 5.0000 mL | ORAL_SOLUTION | ORAL | Status: DC | PRN

## 2013-07-30 MED ORDER — SODIUM CHLORIDE 0.9 % IV SOLN
INTRAVENOUS | Status: DC
  Administered 2013-07-31 (×2): via INTRAVENOUS

## 2013-07-30 MED ORDER — MEMANTINE HCL 10 MG PO TABS
10.0000 mg | ORAL_TABLET | Freq: Two times a day (BID) | ORAL | Status: DC
  Administered 2013-07-30 – 2013-08-01 (×4): 10 mg via ORAL
  Filled 2013-07-30 (×7): qty 1

## 2013-07-30 NOTE — Progress Notes (Signed)
[facility] PROGRESS NOTE  Ricardo Edwards ZOX:[id number] DOB: 08/25/1921 DOA: 07/29/2013 PCP: Tillman Abide, MD  Assessment/Plan: 1. Metabolic encephalopathy due to combination of UTI and acute renal failure.Continue with  IV fluids, IV antibiotic, follow urine culture, avoid benzodiazepines. Improved, more alert.   2. Acute renal failure. Baseline creatinine in December of 2013 was 1, a bladder scan showed 600 cc of urine.  Renal failure probably combination of dehydration and bladder outlet obstruction.  Continue with Foley catheter, Flomax, IV fluids, obtain urine electrolytes, monitor BMP.   3. Hypertension. Continue home dose Norvasc.   4. History of dementia and PTSD. For now we'll continue on Depakote, Namenda, low dose Ativan once a night, and monitor.   5. Incidental thyroid nodule noted on CT scan. outpatient followup by PCP. TSH low at 0.315. Will order Free T 3 and T 4.   6. History of dysphagia.  dysphagia 3 diet with nectar thick liquids, aspiration precautions and feeding assistance. Speech to evaluation.  7. Generalized weakness-poor appetite-20 pound weight loss over the last 3-4 months. Could be due to #5 above or could be terminal decline due to advanced age, protein supplementation, PT eval. and then supportive care.    Code Status: DNR Family Communication:  Disposition Plan: Remain inpatient.    Consultants:  none  Procedures:  none  Antibiotics:  Ceftriaxone 1-18  HPI/Subjective: Alert , oriented to place.   Objective: Filed Vitals:   07/30/13 0500  BP: 136/64  Pulse: 72  Temp: 97.5 F (36.4 C)  Resp: 18    Intake/Output Summary (Last 24 hours) at 07/30/13 0727 Last data filed at 07/30/13 0534  Gross per 24 hour  Intake      0 ml  Output    800 ml  Net   -800 ml   There were no vitals filed for this visit.  Exam:   General:  No distress. Alert  Cardiovascular: S 1, S 2 RRR  Respiratory: CTA  Abdomen: BS present, soft,  NT  Musculoskeletal: no edema.   Data Reviewed: Basic Metabolic Panel:  Recent Labs Lab 07/29/13 1940 07/30/13 0425  NA 138 139  K 4.9 4.1  CL 100 104  CO2 23 20  GLUCOSE 86 86  BUN 53* 46*  CREATININE 1.90* 1.51*  CALCIUM 9.1 8.3*   Liver Function Tests: No results found for this basename: AST, ALT, ALKPHOS, BILITOT, PROT, ALBUMIN,  in the last 168 hours No results found for this basename: LIPASE, AMYLASE,  in the last 168 hours No results found for this basename: AMMONIA,  in the last 168 hours CBC:  Recent Labs Lab 07/29/13 1940 07/30/13 0425  WBC 10.4 7.1  NEUTROABS 7.7  --   HGB 12.6* 10.8*  HCT 38.9* 33.9*  MCV 89.2 89.0  PLT 261 197   Cardiac Enzymes: No results found for this basename: CKTOTAL, CKMB, CKMBINDEX, TROPONINI,  in the last 168 hours BNP (last 3 results) No results found for this basename: PROBNP,  in the last 8760 hours CBG: No results found for this basename: GLUCAP,  in the last 168 hours  No results found for this or any previous visit (from the past 240 hour(s)).   Studies: Dg Chest 1 View  07/29/2013   CLINICAL DATA:  Fall.  Back pain.  EXAM: CHEST - 1 VIEW  COMPARISON:  None.  FINDINGS: 2004 hr. There are low lung volumes. The heart size and mediastinal contours are normal aside from aortic atherosclerosis. There is no evidence of  mediastinal hematoma. There is no confluent airspace opacity, pleural effusion or pneumothorax. No fractures are identified.  IMPRESSION: Low lung volumes.  No evidence of acute chest injury.   Electronically Signed   By: Roxy HorsemanBill  Veazey M.D.   On: 07/29/2013 20:37   Dg Lumbar Spine Complete  07/29/2013   CLINICAL DATA:  Trauma and pain.  EXAM: LUMBAR SPINE - COMPLETE 4+ VIEW  COMPARISON:  None.  FINDINGS: Five lumbar type vertebral bodies. Minimal S-shaped lumbar spine curvature. Aortic atherosclerosis. Sacroiliac joints are symmetric. Probable phleboliths in the pelvis. A mild L1 compression deformity, without  significant canal compromise. Expected for age multilevel spondylosis.  IMPRESSION: Mild L1 compression deformity, of indeterminate acuity.  Aortic atherosclerosis.   Electronically Signed   By: Jeronimo GreavesKyle  Talbot M.D.   On: 07/29/2013 20:38   Dg Sacrum/coccyx  07/29/2013   CLINICAL DATA:  Trauma and pain.  EXAM: SACRUM AND COCCYX - 2+ VIEW  COMPARISON:  DG LUMBAR SPINE COMPLETE dated 07/29/2013  FINDINGS: Femoral heads are located. Sacroiliac joints are symmetric. Mild degenerative irregularity about the inferior coccyx. Degenerative disc disease at L4-5.  IMPRESSION: No acute osseous abnormality.   Electronically Signed   By: Jeronimo GreavesKyle  Talbot M.D.   On: 07/29/2013 20:39   Ct Head Wo Contrast  07/29/2013   CLINICAL DATA:  Fall from wheelchair.  Dementia.  EXAM: CT HEAD WITHOUT CONTRAST  CT CERVICAL SPINE WITHOUT CONTRAST  TECHNIQUE: Multidetector CT imaging of the head and cervical spine was performed following the standard protocol without intravenous contrast. Multiplanar CT image reconstructions of the cervical spine were also generated.  COMPARISON:  None.  FINDINGS: CT HEAD FINDINGS  There is no evidence of acute intracranial hemorrhage, mass lesion, brain edema or extra-axial fluid collection. The ventricles and subarachnoid spaces are diffusely prominent consistent with moderate atrophy. There is no CT evidence of acute cortical infarction. Mild periventricular white matter disease is present.  The visualized paranasal sinuses, mastoid air cells and middle ears are clear. The calvarium is intact.  CT CERVICAL SPINE FINDINGS  There is a convex right scoliosis with mild straightening. There is multilevel spondylosis with facet hypertrophy and ankylosis, especially on the left from C4 through C6. There is no evidence of acute fracture or traumatic subluxation.  No acute soft tissue findings are identified. There is diffuse atherosclerosis of the carotid arteries. There is a knee left thyroid nodule measuring 1.8 cm  on image 76.  IMPRESSION: 1. No acute intracranial or calvarial findings. 2. Moderate atrophy. 3. No evidence of acute cervical spine fracture, traumatic subluxation or static signs of instability. There is multilevel cervical spondylosis with facet ankylosis. 4. Left thyroid nodule. This meets size criteria for additional assessment with ultrasound. However, given his age, that may not be necessary depending on the patient's associated comorbidities.   Electronically Signed   By: Roxy HorsemanBill  Veazey M.D.   On: 07/29/2013 20:15   Ct Cervical Spine Wo Contrast  07/29/2013   CLINICAL DATA:  Fall from wheelchair.  Dementia.  EXAM: CT HEAD WITHOUT CONTRAST  CT CERVICAL SPINE WITHOUT CONTRAST  TECHNIQUE: Multidetector CT imaging of the head and cervical spine was performed following the standard protocol without intravenous contrast. Multiplanar CT image reconstructions of the cervical spine were also generated.  COMPARISON:  None.  FINDINGS: CT HEAD FINDINGS  There is no evidence of acute intracranial hemorrhage, mass lesion, brain edema or extra-axial fluid collection. The ventricles and subarachnoid spaces are diffusely prominent consistent with moderate atrophy. There is no CT  evidence of acute cortical infarction. Mild periventricular white matter disease is present.  The visualized paranasal sinuses, mastoid air cells and middle ears are clear. The calvarium is intact.  CT CERVICAL SPINE FINDINGS  There is a convex right scoliosis with mild straightening. There is multilevel spondylosis with facet hypertrophy and ankylosis, especially on the left from C4 through C6. There is no evidence of acute fracture or traumatic subluxation.  No acute soft tissue findings are identified. There is diffuse atherosclerosis of the carotid arteries. There is a knee left thyroid nodule measuring 1.8 cm on image 76.  IMPRESSION: 1. No acute intracranial or calvarial findings. 2. Moderate atrophy. 3. No evidence of acute cervical spine  fracture, traumatic subluxation or static signs of instability. There is multilevel cervical spondylosis with facet ankylosis. 4. Left thyroid nodule. This meets size criteria for additional assessment with ultrasound. However, given his age, that may not be necessary depending on the patient's associated comorbidities.   Electronically Signed   By: Roxy Horseman M.D.   On: 07/29/2013 20:15    Scheduled Meds: . amLODipine  10 mg Oral QHS  . cefTRIAXone (ROCEPHIN)  IV  1 g Intravenous Q24H  . divalproex  250 mg Oral Daily  . heparin  5,000 Units Subcutaneous Q8H  . lactose free nutrition  237 mL Oral TID WC  . LORazepam  0.5 mg Oral QHS  . memantine  10 mg Oral BID  . tamsulosin  0.4 mg Oral QPC breakfast   Continuous Infusions: . sodium chloride      Principal Problem:   UTI (lower urinary tract infection) Active Problems:   Dementia   Hyperlipidemia   GERD (gastroesophageal reflux disease)   HBP (high blood pressure)   Depression   BPH (benign prostatic hypertrophy)   Fall   Metabolic encephalopathy    Time spent: 35 minutes    Ricardo Edwards  [facility] Pager [phone]. If 7PM-7AM, please contact night-coverage at [email or web address], password [facility] 07/30/2013, 7:27 AM  LOS: 1 day

## 2013-07-30 NOTE — Care Management Note (Addendum)
    Page 1 of 1   07/31/2013     12:54:56 PM   CARE MANAGEMENT NOTE 07/31/2013  Patient:  Ricardo Edwards,Ricardo Edwards   Account Number:  [id number]  Date Initiated:  07/30/2013  Documentation initiated by:  Lorenda IshiharaPEELE,Owen Pagnotta  Subjective/Objective Assessment:   80 yo male admitted with AMS, UTI, acute renal failure. [facility]     Action/Plan:   Likely need SNF at d/c, awaiting PT evals   Anticipated DC Date:  08/02/2013   Anticipated DC Plan:  ASSISTED LIVING / REST HOME  In-house referral  Clinical Social Worker      DC Planning Services  CM consult      Choice offered to / List presented to:     DME arranged  HOSPITAL BED      DME agency  [facility]        Status of service:  Completed, signed off Medicare Important Message given?   (If response is "NO", the following Medicare IM given date fields will be blank) Date Medicare IM given:   Date Additional Medicare IM given:    Discharge Disposition:  ASSISTED LIVING  Per UR Regulation:  Reviewed for med. necessity/level of care/duration of stay  If discussed at Long Length of Stay Meetings, dates discussed:    Comments:  per CSW, [facility] manage [facility]

## 2013-07-30 NOTE — Progress Notes (Signed)
PT Cancellation Note  Patient Details Name: Elizbeth SquiresGeorge Hammerschmidt MRN: [id number] DOB: [birth year]   Cancelled Treatment:    Reason Eval/Treat Not Completed: Patient declined, no reason specified besides "Its bedtime right now." Will check back later this afternoon to attempt eval if pt will participate   Rebeca AlertJannie Levora Werden, MPT Pager: [phone]

## 2013-07-30 NOTE — Evaluation (Signed)
Clinical/Bedside Swallow Evaluation Patient Details  Name: Ricardo Edwards MRN: [id number] Date of Birth: 1921-09-18  Today's Date: 07/30/2013 Time: [id number] SLP Time Calculation (min): 29 min  Past Medical History:  Past Medical History  Diagnosis Date  . Hearing loss   . Dementia 2013  . PTSD (post-traumatic stress disorder)   . Hyperlipidemia   . GERD (gastroesophageal reflux disease)   . Allergic rhinitis due to pollen   . Osteoarthrosis involving, or with mention of more than one site, but not specified as generalized, multiple sites   . HTN (hypertension), benign   . Depression   . BPH (benign prostatic hypertrophy)    Past Surgical History:  Past Surgical History  Procedure Laterality Date  . Appendectomy    . Ear lobe    . Cataract extraction    . Nose surgery    . Tonsillectomy     HPI:  80 yo male resident of [facility] adm [facility] with UTI. CXR negative, MRI negative.  Pt placed on a dys3/nectar diet, MD ordered swallow evaluation.  Ricardo SquiresGeorge Edwards  is a 80 y.o. male, dementia, bilateral hearing loss, hypertension, BPH, GERD, PTSD, dyslipidemia who lives in a memory care unit, has been experiencing ongoing generalized weakness and poor appetite for the last 3-4 months, has become quite weak.  Pt has been little he wheelchair-bound for the last 1 month per MD note, has not been eating or drinking well and has lost about 22 pounds, today he was sitting in his wheelchair and slumped forwards and sustained a fall, he was then brought to the ER where he had an extensive workup of imaging revealing and L-spine compression deformity of unclear date, according to the daughter he's been having low back pain for several months after her previous fall, he also had head and neck CT scan which was unremarkable except for a thyroid nodule , his lab work was suggestive of UTI, acute renal failure, he also had metabolic encephalopathy.     Assessment / Plan / Recommendation Clinical  Impression  Limited evaluation secondary to pt's pain likely decreasing his participation.  Nurse secretary informed of pt's pain.  No focal CN deficits apparent, no s/s of aspiration with minimal intake pt would accept.  Ice, thin, nectar liquids observed with delayed swallow and oral holding.  Pt able to articulate well and therefore anticipate able to masticate adequately - although pt refused cracker and applesauce.  Rec advance to soft/thin with strict precautions.  SLP educated caregiver in room to precautions.     Aspiration Risk  Moderate    Diet Recommendation Dysphagia 3 (Mechanical Soft);Thin liquid   Medication Administration: Whole meds with puree Supervision: Full supervision/cueing for compensatory strategies Compensations: Slow rate;Small sips/bites Postural Changes and/or Swallow Maneuvers: Seated upright 90 degrees;Upright 30-60 min after meal    Other  Recommendations Oral Care Recommendations: Oral care BID   Follow Up Recommendations  Skilled Nursing facility    Frequency and Duration min 1 x/week  1 week   Pertinent Vitals/Pain Afebrile, decreased     Swallow Study Prior Functional Status     pt with poor intake at [facility]  General Date of Onset: 07/30/13 HPI: 80 yo male resident of [facility] with UTI. CXR negative, MRI negative.  Pt placed on a dys3/nectar diet, MD ordered swallow evaluation.  Ricardo Edwards  is a 80 y.o. male, dementia, bilateral hearing loss, hypertension, BPH, GERD, PTSD, dyslipidemia who lives in a memory care unit,  has been experiencing ongoing generalized weakness and poor appetite for the last 3-4 months, has become quite weak.  Pt has been little he wheelchair-bound for the last 1 month per MD note, has not been eating or drinking well and has lost about 22 pounds, today he was sitting in his wheelchair and slumped forwards and sustained a fall, he was then brought to the ER where he had an extensive workup of imaging  revealing and L-spine compression deformity of unclear date, according to the daughter he's been having low back pain for several months after her previous fall, he also had head and neck CT scan which was unremarkable except for a thyroid nodule , his lab work was suggestive of UTI, acute renal failure, he also had metabolic encephalopathy.   Type of Study: Bedside swallow evaluation Previous Swallow Assessment: none Diet Prior to this Study: Dysphagia 3 (soft);Nectar-thick liquids Temperature Spikes Noted: No Respiratory Status: Room air History of Recent Intubation: No Behavior/Cognition: Alert;Uncooperative;Decreased sustained attention;Doesn't follow directions;Distractible Oral Cavity - Dentition: Adequate natural dentition Self-Feeding Abilities: Total assist Patient Positioning: Upright in bed Baseline Vocal Quality: Clear Volitional Cough: Cognitively unable to elicit (pt stated "I don't have time for this nonsense") Volitional Swallow: Unable to elicit    Oral/Motor/Sensory Function Overall Oral Motor/Sensory Function:  (no focal cn deficits, pt generalized weakness- did not follow commands adequately)   Ice Chips Ice chips: Impaired Oral Phase Impairments: Reduced labial seal;Reduced lingual movement/coordination;Impaired anterior to posterior transit Oral Phase Functional Implications: Prolonged oral transit;Oral holding Pharyngeal Phase Impairments: Suspected delayed Swallow   Thin Liquid Thin Liquid: Impaired Presentation: Spoon;Straw Oral Phase Impairments: Reduced labial seal;Reduced lingual movement/coordination;Impaired anterior to posterior transit Oral Phase Functional Implications: Prolonged oral transit;Oral holding Pharyngeal  Phase Impairments: Suspected delayed Swallow    Nectar Thick Nectar Thick Liquid: Impaired Presentation: Spoon Oral Phase Impairments: Impaired anterior to posterior transit;Poor awareness of bolus Oral phase functional implications:  Prolonged oral transit;Oral holding Pharyngeal Phase Impairments: Suspected delayed Swallow   Honey Thick Honey Thick Liquid: Not tested   Puree Puree: Not tested Other Comments: pt declined to accept applesauce   Solid   GO    Other Comments: pt declined to accept solid       Donavan Burnet, MS [facility] SLP [phone]

## 2013-07-30 NOTE — Progress Notes (Signed)
Clinical Social Work Department BRIEF PSYCHOSOCIAL ASSESSMENT 07/30/2013  Patient:  Elizbeth SquiresHALL,Dolph     Account Number:  [id number]     Admit date:  07/29/2013  Clinical Social Worker:  Candie ChromanHAIDINGER,Keyanni Whittinghill, LCSW  Date/Time:  07/30/2013 01:42 PM  Referred by:  Physician  Date Referred:  07/29/2013 Referred for  ALF Placement   Other Referral:   Interview type:  Family Other interview type:    PSYCHOSOCIAL DATA Living Status:  FACILITY Admitted from facility:  [facility] Level of care:  Assisted Living Primary support name:  Larrie KassKaren Ball Primary support relationship to patient:  niece Degree of support available:   supportive    CURRENT CONCERNS Current Concerns  Post-Acute Placement   Other Concerns:    SOCIAL WORK ASSESSMENT / PLAN Pt is an 80 yr old gentleman [facility]. CSW spoke with pt's niece to assist with d/c planning. [facility]. CSW spoke with facility and clinicals have been sent to them for review. CSW will continue to follow to assist with d/c planning.   Assessment/plan status:  Psychosocial Support/Ongoing Assessment of Needs Other assessment/ plan:   Information/referral to community resources:   none needed at this time    PATIENT'S/FAMILY'S RESPONSE TO PLAN OF CARE: Pt's niece is very [facility] and is looking forward to pt returning there when stable.   Cori RazorJamie Artice Bergerson LCSW [phone]

## 2013-07-30 NOTE — Progress Notes (Signed)
INITIAL NUTRITION ASSESSMENT  DOCUMENTATION CODES Per approved criteria  -Not Applicable   INTERVENTION: -Continue Boost Plus TID, each supplements contains 360 kcal and 14 g of protein - Recommend that updated wt be obtained by nursing staff  NUTRITION DIAGNOSIS: Inadequate oral intake related to chronic illness as evidenced by reported intake less than estimated needs.   Goal: Pt to meet >/= 90% of their estimated nutrition needs   Monitor:  Wt, po intake, acceptance of supplements, SLP notes, labs  Reason for Assessment: MST  80 y.o. male  Admitting Dx: UTI (lower urinary tract infection)  ASSESSMENT: 80 y.o. male, dementia, bilateral hearing loss, hypertension, BPH, GERD, PTSD, dyslipidemia who lives in a memory care unit, has been experiencing ongoing generalized weakness and poor appetite for the last 3-4 months, has become quite weak and according to the niece who is the power of attorney he has been little he wheelchair-bound for the last 1 month, has not been eating or drinking well and has lost about 22 pounds  Pt reports that he has not been eating much lately. He says that he just doesn't have much of an appetite. Pt was seen by SLP and was recommended to receive a dys 3 diet with thin liquids. Pt is currently drinking a combination of thickened and thin liquids. He, and his caregiver both reports that he has not been having any problems with choking on either thin or thickened liquids. Per MD note, pt's niece reported that pt has lost around 22 lbs recently. Pt says that he does like Ensure Complete.  Height: Ht Readings from Last 1 Encounters:  11/12/10 5\' 9"  (1.753 m)    Weight: Wt Readings from Last 1 Encounters:  03/08/13 207 lb (93.895 kg)    Ideal Body Weight: 160 lbs  % Ideal Body Weight: 115% if pt has lost 22 lbs  Wt Readings from Last 10 Encounters:  03/08/13 207 lb (93.895 kg)  01/25/13 207 lb (93.895 kg)  11/02/12 198 lb (89.812 kg)  08/31/12  192 lb (87.091 kg)  06/01/12 189 lb (85.73 kg)  04/27/12 193 lb (87.544 kg)  02/10/12 184 lb (83.462 kg)  11/25/11 181 lb (82.101 kg)  11/12/10 190 lb (86.183 kg)    Usual Body Weight: 200 lbs   % Usual Body Weight: 89% if pt has lost 22 lbs  BMI:  There is no weight on file to calculate BMI.  Estimated Nutritional Needs: Kcal: 2000-2300 Protein: 100-110 g Fluid: 2.0-2.3 L  Skin: abrasion on left arm  Diet Order: Dysphagia  EDUCATION NEEDS: -Education not appropriate at this time   Intake/Output Summary (Last 24 hours) at 07/30/13 1339 Last data filed at 07/30/13 1000  Gross per 24 hour  Intake 256.67 ml  Output   1300 ml  Net -1043.33 ml    Last BM: none recorded   Labs:   Recent Labs Lab 07/29/13 1940 07/30/13 0425  NA 138 139  K 4.9 4.1  CL 100 104  CO2 23 20  BUN 53* 46*  CREATININE 1.90* 1.51*  CALCIUM 9.1 8.3*  GLUCOSE 86 86    CBG (last 3)  No results found for this basename: GLUCAP,  in the last 72 hours  Scheduled Meds: . amLODipine  10 mg Oral QHS  . cefTRIAXone (ROCEPHIN)  IV  1 g Intravenous Q24H  . divalproex  250 mg Oral Daily  . heparin  5,000 Units Subcutaneous Q8H  . lactose free nutrition  237 mL Oral TID WC  .  LORazepam  0.5 mg Oral QHS  . memantine  10 mg Oral BID  . tamsulosin  0.4 mg Oral QPC breakfast    Continuous Infusions: . sodium chloride      Past Medical History  Diagnosis Date  . Hearing loss   . Dementia 2013  . PTSD (post-traumatic stress disorder)   . Hyperlipidemia   . GERD (gastroesophageal reflux disease)   . Allergic rhinitis due to pollen   . Osteoarthrosis involving, or with mention of more than one site, but not specified as generalized, multiple sites   . HTN (hypertension), benign   . Depression   . BPH (benign prostatic hypertrophy)     Past Surgical History  Procedure Laterality Date  . Appendectomy    . Ear lobe    . Cataract extraction    . Nose surgery    . Tonsillectomy       Ebbie LatusHaley Hawkins RD, LDN

## 2013-07-30 NOTE — Evaluation (Signed)
Physical Therapy Evaluation Patient Details Name: Ricardo SquiresGeorge Edwards MRN: [id number] DOB: 02-Jun-1922 Today's Date: 07/30/2013 Time: [id number] PT Time Calculation (min): 12 min  PT Assessment / Plan / Recommendation History of Present Illness  80 yo male admitted with UTI, L1 comp fx, acute renal failure. Hx of dementia, PTSD, GERD, OA, depression, HOH, HTN  Clinical Impression  On eval, pt required Mod assist for mobility (+2 for safety)-able to stand and take a few side steps [facility] with [facility]. Recommend SNF unless ALF can provide current level of care.     PT Assessment  Patient needs continued PT services    Follow Up Recommendations  SNF (unless ALF can provide current level of care)    Does the patient have the potential to tolerate intense rehabilitation      Barriers to Discharge        Equipment Recommendations  None recommended by PT    Recommendations for Other Services     Frequency Min 3X/week    Precautions / Restrictions Precautions Precautions: Fall Restrictions Weight Bearing Restrictions: No   Pertinent Vitals/Pain "hand from being stuck"  (IV nurse had just left room)      Mobility  Bed Mobility Overal bed mobility: Needs Assistance Bed Mobility: Supine to Sit;Sit to Supine Supine to sit: Mod assist Sit to supine: Min assist General bed mobility comments: Assist for trunk and LEs. Increased time. Multimodal cues for safety, technique,hand placement Transfers Overall transfer level: Needs assistance Transfers: Sit to/from Stand Sit to Stand: Mod assist General transfer comment: Assist to rise, stabilize, control descent. VC safety, technique, hand placement Ambulation/Gait Ambulation/Gait assistance: Mod assist General Gait Details: [facility].     Exercises     PT Diagnosis: Difficulty walking;Generalized weakness  PT Problem List: Decreased strength;Decreased activity tolerance;Decreased balance;Decreased  mobility;Decreased knowledge of use of DME;Decreased cognition;Decreased safety awareness PT Treatment Interventions: DME instruction;Gait training;Functional mobility training;Therapeutic activities;Therapeutic exercise;Patient/family education     PT Goals(Current goals can be found in the care plan section) Acute Rehab PT Goals Patient Stated Goal: none stated PT Goal Formulation: Patient unable to participate in goal setting Time For Goal Achievement: 08/13/13 Potential to Achieve Goals: Fair  Visit Information  Last PT Received On: 07/30/13 Assistance Needed: +2 (safety) History of Present Illness: 80 yo male admitted with UTI, L1 comp fx, acute renal failure. Hx of dementia, PTSD, GERD, OA, depression, HOH, HTN       Prior Functioning  Home Living Type of Home: Assisted living Home Equipment: Wheelchair - manual Prior Function Level of Independence: Needs assistance Communication Communication: No difficulties    Cognition  Cognition Arousal/Alertness: Awake/alert Behavior During Therapy: WFL for tasks assessed/performed Overall Cognitive Status: History of cognitive impairments - at baseline    Extremity/Trunk Assessment Upper Extremity Assessment Upper Extremity Assessment: Generalized weakness Lower Extremity Assessment Lower Extremity Assessment: Generalized weakness Cervical / Trunk Assessment Cervical / Trunk Assessment: Kyphotic   Balance    End of Session PT - End of Session Equipment Utilized During Treatment: Gait belt Activity Tolerance: Patient tolerated treatment well Patient left: in bed;with call bell/phone within reach;with bed alarm set;with nursing/sitter in room  GP     Rebeca AlertJannie Mostyn Varnell, MPT Pager: [phone]

## 2013-07-31 LAB — TSH: TSH: 0.284 u[IU]/mL — ABNORMAL LOW (ref 0.350–4.500)

## 2013-07-31 NOTE — Progress Notes (Signed)
Dr Thedore MinsSingh notified of positive blood culture gram clusters per lab.

## 2013-07-31 NOTE — Progress Notes (Signed)
Hospital bed necessary as bed wedges do not provide adequate elevation to resolve issues with aspiration. Patient requires frequent changes in body position which cannot be achieved with normal bed.

## 2013-07-31 NOTE — Progress Notes (Addendum)
[facility] PROGRESS NOTE  Tamir Wallman ZOX:[id number] DOB: 11/08/1921 DOA: 07/29/2013 PCP: No primary provider on file.  Assessment/Plan:   1. Metabolic encephalopathy due to combination of UTI and acute renal failure.Continue with  IV fluids, IV antibiotic, follow urine culture, avoid benzodiazepines. Improved, more alert. We'll have PT evaluate, social work to look into placement   Follow urine culture results.  Note one out of 2 blood cultures positive for gram-positive cocci. This likely is contamination. Patient has clinically responded well to Rocephin which will be continued. Follow final blood culture results.     2. Acute renal failure. Baseline creatinine in December of 2013 was 1, a bladder scan showed 600 cc of urine.  Renal failure probably combination of dehydration and bladder outlet obstruction. Continue with Foley catheter, Flomax, IV fluids, obtain urine electrolytes, monitor BMP, renal function improving.      3. Hypertension. Continue home dose Norvasc.      4. History of dementia and PTSD. For now we'll continue on Depakote, Namenda, low dose Ativan once a night, and monitor.      5. Incidental thyroid nodule noted on CT scan. outpatient followup by PCP not a candidate for aggressive diagnostic tests or intervention. TSH low at 0.315 with normal Free T 3 and T 4. Repeat TSH today     6. History of dysphagia.  dysphagia 3 diet with nectar thick liquids, aspiration precautions and feeding assistance. Speech following.     7. Generalized weakness-poor appetite-20 pound weight loss over the last 3-4 months. Could be due to #5 above or could be terminal decline due to advanced age, protein supplementation, PT eval. SNF recommended social work consulted.    Code Status: DNR Family Communication:  Disposition Plan: SNF   Consultants:  none  Procedures:  none  Antibiotics:  Ceftriaxone 1-18  HPI/Subjective: Alert , oriented to place.    Objective: Filed Vitals:   07/31/13 0547  BP: 160/70  Pulse: 79  Temp: 98 F (36.7 C)  Resp: 18    Intake/Output Summary (Last 24 hours) at 07/31/13 1254 Last data filed at 07/31/13 1000  Gross per 24 hour  Intake   2490 ml  Output   1400 ml  Net   1090 ml   There were no vitals filed for this visit.  Exam:   General:  No distress. Alert  Cardiovascular: S 1, S 2 RRR  Respiratory: CTA  Abdomen: BS present, soft, NT  Musculoskeletal: no edema.   Data Reviewed: Basic Metabolic Panel:  Recent Labs Lab 07/29/13 1940 07/30/13 0425  NA 138 139  K 4.9 4.1  CL 100 104  CO2 23 20  GLUCOSE 86 86  BUN 53* 46*  CREATININE 1.90* 1.51*  CALCIUM 9.1 8.3*   Liver Function Tests: No results found for this basename: AST, ALT, ALKPHOS, BILITOT, PROT, ALBUMIN,  in the last 168 hours No results found for this basename: LIPASE, AMYLASE,  in the last 168 hours No results found for this basename: AMMONIA,  in the last 168 hours CBC:  Recent Labs Lab 07/29/13 1940 07/30/13 0425  WBC 10.4 7.1  NEUTROABS 7.7  --   HGB 12.6* 10.8*  HCT 38.9* 33.9*  MCV 89.2 89.0  PLT 261 197   Cardiac Enzymes: No results found for this basename: CKTOTAL, CKMB, CKMBINDEX, TROPONINI,  in the last 168 hours BNP (last 3 results) No results found for this basename: PROBNP,  in the last 8760 hours CBG: No results found for this basename:  GLUCAP,  in the last 168 hours  Recent Results (from the past 240 hour(s))  CULTURE, BLOOD (ROUTINE X 2)     Status: None   Collection Time    07/29/13  7:40 PM      Result Value Range Status   Specimen Description BLOOD LEFT ARM   Final   Special Requests BOTTLES DRAWN AEROBIC AND ANAEROBIC 5CC   Final   Culture  Setup Time     Final   Value: 07/30/2013 08:53     Performed at [facility]   Culture     Final   Value: GRAM POSITIVE COCCI IN CLUSTERS     Note: Gram Stain Report Called to,Read Back By and Verified With: ANN C RN BY INGRAM A  1/20 1015AM     Performed at [facility]   Report Status PENDING   Incomplete  URINE CULTURE     Status: None   Collection Time    07/29/13  7:48 PM      Result Value Range Status   Specimen Description URINE, CATHETERIZED   Final   Special Requests NONE   Final   Culture  Setup Time     Final   Value: 07/30/2013 08:03     Performed at [facility] Count     Final   Value: >=100,000 COLONIES/ML     Performed at [facility]   Culture     Final   Value: GRAM NEGATIVE RODS     Performed at [facility]   Report Status PENDING   Incomplete  CULTURE, BLOOD (ROUTINE X 2)     Status: None   Collection Time    07/29/13 11:39 PM      Result Value Range Status   Specimen Description BLOOD RIGHT HAND   Final   Special Requests BOTTLES DRAWN AEROBIC AND ANAEROBIC 2CC   Final   Culture  Setup Time     Final   Value: 07/30/2013 08:53     Performed at [facility]   Culture     Final   Value:        BLOOD CULTURE RECEIVED NO GROWTH TO DATE CULTURE WILL BE HELD FOR 5 DAYS BEFORE ISSUING A FINAL NEGATIVE REPORT     Performed at [facility]   Report Status PENDING   Incomplete     Studies: Dg Chest 1 View  07/29/2013   CLINICAL DATA:  Fall.  Back pain.  EXAM: CHEST - 1 VIEW  COMPARISON:  None.  FINDINGS: 2004 hr. There are low lung volumes. The heart size and mediastinal contours are normal aside from aortic atherosclerosis. There is no evidence of mediastinal hematoma. There is no confluent airspace opacity, pleural effusion or pneumothorax. No fractures are identified.  IMPRESSION: Low lung volumes.  No evidence of acute chest injury.   Electronically Signed   By: Roxy Horseman M.D.   On: 07/29/2013 20:37   Dg Lumbar Spine Complete  07/29/2013   CLINICAL DATA:  Trauma and pain.  EXAM: LUMBAR SPINE - COMPLETE 4+ VIEW  COMPARISON:  None.  FINDINGS: Five lumbar type vertebral bodies. Minimal S-shaped lumbar spine curvature. Aortic  atherosclerosis. Sacroiliac joints are symmetric. Probable phleboliths in the pelvis. A mild L1 compression deformity, without significant canal compromise. Expected for age multilevel spondylosis.  IMPRESSION: Mild L1 compression deformity, of indeterminate acuity.  Aortic atherosclerosis.   Electronically Signed   By: Hosie Spangle.D.  On: 07/29/2013 20:38   Dg Sacrum/coccyx  07/29/2013   CLINICAL DATA:  Trauma and pain.  EXAM: SACRUM AND COCCYX - 2+ VIEW  COMPARISON:  DG LUMBAR SPINE COMPLETE dated 07/29/2013  FINDINGS: Femoral heads are located. Sacroiliac joints are symmetric. Mild degenerative irregularity about the inferior coccyx. Degenerative disc disease at L4-5.  IMPRESSION: No acute osseous abnormality.   Electronically Signed   By: Jeronimo GreavesKyle  Talbot M.D.   On: 07/29/2013 20:39   Ct Head Wo Contrast  07/29/2013   CLINICAL DATA:  Fall from wheelchair.  Dementia.  EXAM: CT HEAD WITHOUT CONTRAST  CT CERVICAL SPINE WITHOUT CONTRAST  TECHNIQUE: Multidetector CT imaging of the head and cervical spine was performed following the standard protocol without intravenous contrast. Multiplanar CT image reconstructions of the cervical spine were also generated.  COMPARISON:  None.  FINDINGS: CT HEAD FINDINGS  There is no evidence of acute intracranial hemorrhage, mass lesion, brain edema or extra-axial fluid collection. The ventricles and subarachnoid spaces are diffusely prominent consistent with moderate atrophy. There is no CT evidence of acute cortical infarction. Mild periventricular white matter disease is present.  The visualized paranasal sinuses, mastoid air cells and middle ears are clear. The calvarium is intact.  CT CERVICAL SPINE FINDINGS  There is a convex right scoliosis with mild straightening. There is multilevel spondylosis with facet hypertrophy and ankylosis, especially on the left from C4 through C6. There is no evidence of acute fracture or traumatic subluxation.  No acute soft tissue findings  are identified. There is diffuse atherosclerosis of the carotid arteries. There is a knee left thyroid nodule measuring 1.8 cm on image 76.  IMPRESSION: 1. No acute intracranial or calvarial findings. 2. Moderate atrophy. 3. No evidence of acute cervical spine fracture, traumatic subluxation or static signs of instability. There is multilevel cervical spondylosis with facet ankylosis. 4. Left thyroid nodule. This meets size criteria for additional assessment with ultrasound. However, given his age, that may not be necessary depending on the patient's associated comorbidities.   Electronically Signed   By: Roxy HorsemanBill  Veazey M.D.   On: 07/29/2013 20:15   Ct Cervical Spine Wo Contrast  07/29/2013   CLINICAL DATA:  Fall from wheelchair.  Dementia.  EXAM: CT HEAD WITHOUT CONTRAST  CT CERVICAL SPINE WITHOUT CONTRAST  TECHNIQUE: Multidetector CT imaging of the head and cervical spine was performed following the standard protocol without intravenous contrast. Multiplanar CT image reconstructions of the cervical spine were also generated.  COMPARISON:  None.  FINDINGS: CT HEAD FINDINGS  There is no evidence of acute intracranial hemorrhage, mass lesion, brain edema or extra-axial fluid collection. The ventricles and subarachnoid spaces are diffusely prominent consistent with moderate atrophy. There is no CT evidence of acute cortical infarction. Mild periventricular white matter disease is present.  The visualized paranasal sinuses, mastoid air cells and middle ears are clear. The calvarium is intact.  CT CERVICAL SPINE FINDINGS  There is a convex right scoliosis with mild straightening. There is multilevel spondylosis with facet hypertrophy and ankylosis, especially on the left from C4 through C6. There is no evidence of acute fracture or traumatic subluxation.  No acute soft tissue findings are identified. There is diffuse atherosclerosis of the carotid arteries. There is a knee left thyroid nodule measuring 1.8 cm on image  76.  IMPRESSION: 1. No acute intracranial or calvarial findings. 2. Moderate atrophy. 3. No evidence of acute cervical spine fracture, traumatic subluxation or static signs of instability. There is multilevel cervical spondylosis with facet  ankylosis. 4. Left thyroid nodule. This meets size criteria for additional assessment with ultrasound. However, given his age, that may not be necessary depending on the patient's associated comorbidities.   Electronically Signed   By: Roxy Horseman M.D.   On: 07/29/2013 20:15    Scheduled Meds: . cefTRIAXone (ROCEPHIN)  IV  1 g Intravenous Q24H  . divalproex  250 mg Oral Daily  . heparin  5,000 Units Subcutaneous Q8H  . lactose free nutrition  237 mL Oral TID WC  . LORazepam  0.5 mg Oral QHS  . memantine  10 mg Oral BID  . tamsulosin  0.4 mg Oral QPC breakfast   Continuous Infusions: . sodium chloride 100 mL/hr at 07/31/13 0809    Principal Problem:   UTI (lower urinary tract infection) Active Problems:   Dementia   Hyperlipidemia   GERD (gastroesophageal reflux disease)   HBP (high blood pressure)   Depression   BPH (benign prostatic hypertrophy)   Fall   Metabolic encephalopathy    Time spent: 35 minutes    [facility]  [facility] Pager [phone]. If 7PM-7AM, please contact night-coverage at [email or web address], password [facility] 07/31/2013, 12:54 PM  LOS: 2 days

## 2013-08-01 DIAGNOSIS — J301 Allergic rhinitis due to pollen: Secondary | ICD-10-CM

## 2013-08-01 LAB — BASIC METABOLIC PANEL
BUN: 20 mg/dL (ref 6–23)
CHLORIDE: 106 meq/L (ref 96–112)
CO2: 24 meq/L (ref 19–32)
CREATININE: 0.92 mg/dL (ref 0.50–1.35)
Calcium: 8.3 mg/dL — ABNORMAL LOW (ref 8.4–10.5)
GFR calc non Af Amer: 73 mL/min — ABNORMAL LOW (ref >90)
GFR, EST AFRICAN AMERICAN: 84 mL/min — AB (ref >90)
GLUCOSE: 80 mg/dL (ref 70–99)
Potassium: 3.8 mEq/L (ref 3.7–5.3)
Sodium: 142 mEq/L (ref 137–147)

## 2013-08-01 LAB — CBC
HEMATOCRIT: 34.6 % — AB (ref 39.0–52.0)
HEMOGLOBIN: 11.7 g/dL — AB (ref 13.0–17.0)
MCH: 29.8 pg (ref 26.0–34.0)
MCHC: 33.8 g/dL (ref 30.0–36.0)
MCV: 88.3 fL (ref 78.0–100.0)
Platelets: 201 10*3/uL (ref 150–400)
RBC: 3.92 MIL/uL — ABNORMAL LOW (ref 4.22–5.81)
RDW: 14.3 % (ref 11.5–15.5)
WBC: 7.4 10*3/uL (ref 4.0–10.5)

## 2013-08-01 LAB — URINE CULTURE

## 2013-08-01 MED ORDER — HALOPERIDOL LACTATE 5 MG/ML IJ SOLN
2.5000 mg | Freq: Once | INTRAMUSCULAR | Status: AC
  Administered 2013-08-01: 2.5 mg via INTRAVENOUS
  Filled 2013-08-01: qty 1

## 2013-08-01 MED ORDER — LORAZEPAM 0.5 MG PO TABS: 0.5000 mg | ORAL_TABLET | Freq: Every day | ORAL | Status: DC

## 2013-08-01 NOTE — Discharge Summary (Signed)
Physician Discharge Summary  Ricardo Edwards ZOX:[id number] DOB: 07-Aug-1921 DOA: 07/29/2013  PCP: No primary provider on file.  Admit date: 07/29/2013 Discharge date: 08/01/2013  Recommendations for Outpatient Follow-up:  1. Pt will need to follow up with PCP at the facility 2. Continue Bactrim as previously started for UTI on 07/29/2013, complete for total of 7 days, stop date 08/04/2013    Discharge Diagnoses: UTI Principal Problem:   UTI (lower urinary tract infection) Active Problems:   Dementia   Hyperlipidemia   GERD (gastroesophageal reflux disease)   HBP (high blood pressure)   Depression   BPH (benign prostatic hypertrophy)   Fall   Metabolic encephalopathy  Discharge Condition: Stable  Diet recommendation: Heart healthy diet discussed in details   History of present illness:  80 y.o. Male with dementia, bilateral hearing loss, hypertension, BPH, GERD, PTSD, dyslipidemia who lives in a memory care unit, has been experiencing ongoing generalized weakness and poor appetite for the last 3-4 months, has become quite weak and according to the niece who is the power of attorney he has been little he wheelchair-bound for the last 1 month, has not been eating or drinking well and has lost about 22 pounds, today he was sitting in his wheelchair and slumped forwards and sustained a fall, he was then brought to the ER where he had an extensive workup of imaging revealing and L-spine compression deformity of unclear date, according to the daughter he's been having low back pain for several months after her previous fall, he also had head and neck CT scan which was unremarkable except for a thyroid nodule , his lab work was suggestive of UTI, acute renal failure, he also had metabolic encephalopathy I was called to admit the patient .   Hospital Course:  Principal Problem:   UTI (lower urinary tract infection) - pt started on Rocephin, responded well - continue Bactrim upon discharge, stop  date 08/04/2013 - urine culture show sensitivity to Bactrim  Active Problems:   Dementia - stable, discharge to memory care unit today    Depression - stable, ativan as needed    BPH (benign prostatic hypertrophy) - consider Flomax, leave up to PCP and family to decide    Fall - no pain this AM   Metabolic encephalopathy - per care giver at bedside, pt at baseline  - this was determined to be multifactorial and secondary to dehydration and, UTI, ARF   Incidental thyroid nodule noted on CT scan - outpatient follow up per PCP - TSH low at 0.315 with normal Free T 3 and T 4.   History of dysphagia.  - dysphagia 3 diet with nectar thick liquids, aspiration precautions and feeding assistance.   Acute renal failure - secondary to dehydration and poor oral intake - IVF provided and pt responded well - Cr is WNL this AM  Consultants:  none Procedures:  none Antibiotics:  Ceftriaxone 1-18, continue Bactrim upon discharge   Procedures/Studies: Dg Chest 1 View  07/29/2013   CLINICAL DATA:  Fall.  Back pain.  EXAM: CHEST - 1 VIEW  COMPARISON:  None.  FINDINGS: 2004 hr. There are low lung volumes. The heart size and mediastinal contours are normal aside from aortic atherosclerosis. There is no evidence of mediastinal hematoma. There is no confluent airspace opacity, pleural effusion or pneumothorax. No fractures are identified.  IMPRESSION: Low lung volumes.  No evidence of acute chest injury.   Electronically Signed   By: Roxy Horseman M.D.   On: 07/29/2013  20:37   Dg Lumbar Spine Complete  07/29/2013   CLINICAL DATA:  Trauma and pain.  EXAM: LUMBAR SPINE - COMPLETE 4+ VIEW  COMPARISON:  None.  FINDINGS: Five lumbar type vertebral bodies. Minimal S-shaped lumbar spine curvature. Aortic atherosclerosis. Sacroiliac joints are symmetric. Probable phleboliths in the pelvis. A mild L1 compression deformity, without significant canal compromise. Expected for age multilevel spondylosis.  IMPRESSION:  Mild L1 compression deformity, of indeterminate acuity.  Aortic atherosclerosis.   Electronically Signed   By: Jeronimo GreavesKyle  Talbot M.D.   On: 07/29/2013 20:38   Dg Sacrum/coccyx  07/29/2013   CLINICAL DATA:  Trauma and pain.  EXAM: SACRUM AND COCCYX - 2+ VIEW  COMPARISON:  DG LUMBAR SPINE COMPLETE dated 07/29/2013  FINDINGS: Femoral heads are located. Sacroiliac joints are symmetric. Mild degenerative irregularity about the inferior coccyx. Degenerative disc disease at L4-5.  IMPRESSION: No acute osseous abnormality.   Electronically Signed   By: Jeronimo GreavesKyle  Talbot M.D.   On: 07/29/2013 20:39   Ct Head Wo Contrast  07/29/2013   CLINICAL DATA:  Fall from wheelchair.  Dementia.  EXAM: CT HEAD WITHOUT CONTRAST  CT CERVICAL SPINE WITHOUT CONTRAST  TECHNIQUE: Multidetector CT imaging of the head and cervical spine was performed following the standard protocol without intravenous contrast. Multiplanar CT image reconstructions of the cervical spine were also generated.  COMPARISON:  None.  FINDINGS: CT HEAD FINDINGS  There is no evidence of acute intracranial hemorrhage, mass lesion, brain edema or extra-axial fluid collection. The ventricles and subarachnoid spaces are diffusely prominent consistent with moderate atrophy. There is no CT evidence of acute cortical infarction. Mild periventricular white matter disease is present.  The visualized paranasal sinuses, mastoid air cells and middle ears are clear. The calvarium is intact.  CT CERVICAL SPINE FINDINGS  There is a convex right scoliosis with mild straightening. There is multilevel spondylosis with facet hypertrophy and ankylosis, especially on the left from C4 through C6. There is no evidence of acute fracture or traumatic subluxation.  No acute soft tissue findings are identified. There is diffuse atherosclerosis of the carotid arteries. There is a knee left thyroid nodule measuring 1.8 cm on image 76.  IMPRESSION: 1. No acute intracranial or calvarial findings. 2.  Moderate atrophy. 3. No evidence of acute cervical spine fracture, traumatic subluxation or static signs of instability. There is multilevel cervical spondylosis with facet ankylosis. 4. Left thyroid nodule. This meets size criteria for additional assessment with ultrasound. However, given his age, that may not be necessary depending on the patient's associated comorbidities.   Electronically Signed   By: Roxy HorsemanBill  Veazey M.D.   On: 07/29/2013 20:15   Ct Cervical Spine Wo Contrast  07/29/2013   CLINICAL DATA:  Fall from wheelchair.  Dementia.  EXAM: CT HEAD WITHOUT CONTRAST  CT CERVICAL SPINE WITHOUT CONTRAST  TECHNIQUE: Multidetector CT imaging of the head and cervical spine was performed following the standard protocol without intravenous contrast. Multiplanar CT image reconstructions of the cervical spine were also generated.  COMPARISON:  None.  FINDINGS: CT HEAD FINDINGS  There is no evidence of acute intracranial hemorrhage, mass lesion, brain edema or extra-axial fluid collection. The ventricles and subarachnoid spaces are diffusely prominent consistent with moderate atrophy. There is no CT evidence of acute cortical infarction. Mild periventricular white matter disease is present.  The visualized paranasal sinuses, mastoid air cells and middle ears are clear. The calvarium is intact.  CT CERVICAL SPINE FINDINGS  There is a convex right scoliosis with mild  straightening. There is multilevel spondylosis with facet hypertrophy and ankylosis, especially on the left from C4 through C6. There is no evidence of acute fracture or traumatic subluxation.  No acute soft tissue findings are identified. There is diffuse atherosclerosis of the carotid arteries. There is a knee left thyroid nodule measuring 1.8 cm on image 76.  IMPRESSION: 1. No acute intracranial or calvarial findings. 2. Moderate atrophy. 3. No evidence of acute cervical spine fracture, traumatic subluxation or static signs of instability. There is  multilevel cervical spondylosis with facet ankylosis. 4. Left thyroid nodule. This meets size criteria for additional assessment with ultrasound. However, given his age, that may not be necessary depending on the patient's associated comorbidities.   Electronically Signed   By: Roxy Horseman M.D.   On: 07/29/2013 20:15   Discharge Exam: Filed Vitals:   08/01/13 0639  BP: 122/68  Pulse: 77  Temp: 97.7 F (36.5 C)  Resp: 18   Filed Vitals:   07/31/13 0547 07/31/13 1417 07/31/13 2155 08/01/13 0639  BP: 160/70 121/65 119/64 122/68  Pulse: 79 64 69 77  Temp: 98 F (36.7 C) 98 F (36.7 C) 97.9 F (36.6 C) 97.7 F (36.5 C)  TempSrc: Oral Oral Oral Oral  Resp: 18 20 18 18   SpO2: 98% 99% 96% 95%    General: Pt not in acute distress Cardiovascular: Regular rate and rhythm, S1/S2 +, no murmurs, no rubs, no gallops Respiratory: Clear to auscultation bilaterally, no wheezing, no crackles, no rhonchi Abdominal: Soft, non tender, non distended, bowel sounds +, no guarding Extremities: no edema, no cyanosis, pulses palpable bilaterally DP and PT  Discharge Instructions  Discharge Orders   Future Orders Complete By Expires   Diet - low sodium heart healthy  As directed    Increase activity slowly  As directed        Medication List         acetaminophen 500 MG tablet  Commonly known as:  TYLENOL  Take 1,000 mg by mouth 3 (three) times daily as needed for pain.     amLODipine 10 MG tablet  Commonly known as:  NORVASC  Take 10 mg by mouth at bedtime.     DEPAKOTE 250 MG DR tablet  Generic drug:  divalproex  Take 250 mg by mouth daily. For 7 days then stop     LORazepam 0.5 MG tablet  Commonly known as:  ATIVAN  Take 0.25-0.5 mg by mouth See admin instructions. Take 1/2 tab once daily at 1pm     LORazepam 0.5 MG tablet  Commonly known as:  ATIVAN  Take 0.5 mg by mouth every 4 (four) hours as needed for anxiety.     Melatonin 1 MG Tabs  Take 1 tablet by mouth at bedtime as  needed (sleep).     memantine 10 MG tablet  Commonly known as:  NAMENDA  Take 10 mg by mouth 2 (two) times daily.     mirtazapine 30 MG tablet  Commonly known as:  REMERON  Take 30 mg by mouth at bedtime.     multivitamin-lutein Caps capsule  Take 1 capsule by mouth 2 (two) times daily.     polyethylene glycol packet  Commonly known as:  MIRALAX / GLYCOLAX  Take 17 g by mouth daily as needed for mild constipation.     potassium chloride SA 20 MEQ tablet  Commonly known as:  K-DUR,KLOR-CON  TAKE 2 TABLETS BY MOUTH EACH MORNING AND AT LUNCH FOR POTASSIUM SUPPLEMENT. MAY  DISSOLVE/DO NOT CRUSH.     sulfamethoxazole-trimethoprim 400-80 MG per tablet  Commonly known as:  BACTRIM,SEPTRA  Take 1 tablet by mouth 2 (two) times daily. For 7 days           Follow-up Information   Please follow up. (PCP at the facillity )        The results of significant diagnostics from this hospitalization (including imaging, microbiology, ancillary and laboratory) are listed below for reference.     Microbiology: Recent Results (from the past 240 hour(s))  CULTURE, BLOOD (ROUTINE X 2)     Status: None   Collection Time    07/29/13  7:40 PM      Result Value Range Status   Specimen Description BLOOD LEFT ARM   Final   Special Requests BOTTLES DRAWN AEROBIC AND ANAEROBIC 5CC   Final   Culture  Setup Time     Final   Value: 07/30/2013 08:53     Performed at [facility]   Culture     Final   Value: GRAM POSITIVE COCCI IN CLUSTERS     Note: Gram Stain Report Called to,Read Back By and Verified With: ANN C RN BY INGRAM A 1/20 1015AM     Performed at [facility]   Report Status PENDING   Incomplete  URINE CULTURE     Status: None   Collection Time    07/29/13  7:48 PM      Result Value Range Status   Specimen Description URINE, CATHETERIZED   Final   Special Requests NONE   Final   Culture  Setup Time     Final   Value: 07/30/2013 08:03     Performed at [facility] Count     Final   Value: >=100,000 COLONIES/ML     Performed at [facility]   Culture     Final   Value: PROTEUS MIRABILIS     Performed at [facility]   Report Status 08/01/2013 FINAL   Final   Organism ID, Bacteria PROTEUS MIRABILIS   Final  CULTURE, BLOOD (ROUTINE X 2)     Status: None   Collection Time    07/29/13 11:39 PM      Result Value Range Status   Specimen Description BLOOD RIGHT HAND   Final   Special Requests BOTTLES DRAWN AEROBIC AND ANAEROBIC 2CC   Final   Culture  Setup Time     Final   Value: 07/30/2013 08:53     Performed at [facility]   Culture     Final   Value:        BLOOD CULTURE RECEIVED NO GROWTH TO DATE CULTURE WILL BE HELD FOR 5 DAYS BEFORE ISSUING A FINAL NEGATIVE REPORT     Performed at [facility]   Report Status PENDING   Incomplete     Labs: Basic Metabolic Panel:  Recent Labs Lab 07/29/13 1940 07/30/13 0425 08/01/13 0506  NA 138 139 142  K 4.9 4.1 3.8  CL 100 104 106  CO2 23 20 24   GLUCOSE 86 86 80  BUN 53* 46* 20  CREATININE 1.90* 1.51* 0.92  CALCIUM 9.1 8.3* 8.3*   CBC:  Recent Labs Lab 07/29/13 1940 07/30/13 0425 08/01/13 0506  WBC 10.4 7.1 7.4  NEUTROABS 7.7  --   --   HGB 12.6* 10.8* 11.7*  HCT 38.9* 33.9* 34.6*  MCV 89.2 89.0 88.3  PLT 261  197 201   SIGNED: Time coordinating discharge: Over 30 minutes  Debbora Presto, MD  [facility] 08/01/2013, 12:33 PM Pager [phone] Cell [phone]  If 7PM-7AM, please contact night-coverage [email or web address] Password [email or web address]

## 2013-08-01 NOTE — Progress Notes (Signed)
Pt combative, swinging fists and kicking legs in air. Also pulling at IV. Paged MD on call. Orders received. Will continue to monitor pt.

## 2013-08-01 NOTE — Progress Notes (Signed)
Speech Language Pathology Treatment: Dysphagia  Patient Details Name: Ricardo Edwards MRN: [id number] DOB: 29-May-1922 Today's Date: 08/01/2013 Time: 0912-0936 SLP Time Calculation (min): 24 min  Assessment / Plan / Recommendation Clinical Impression  Much improved mental status/participation today and pt without complaint of pain.  Assisted to set pt up for breakfast and observed him to self feed sausage, eggs, Boost and water.  Mildly prolonged mastication with impulsivity noted and subtle cough x2.  Per sitter in room, she has been giving him thin liquids *based on SLP recommendations from Monday* and he has been tolerating.    Suspect pt's swallow function is at baseline at this time, recommend to continue aspiration precautions but do not deem further SLP needed nor use of thickener.  Educated Actuary and pt to clear oral residuals after meals.  SLP to sign off as all education is complete.    HPI HPI: 80 yo male resident of [facility] with UTI. CXR negative, MRI negative.  Pt placed on a dys3/nectar diet, swallow evaluation completed with recommendation for diet advancement.  PMH + for dementia, bilateral hearing loss, hypertension, BPH, GERD, PTSD, dyslipidemia who lives in a memory care unit, has been experiencing ongoing generalized weakness and poor appetite for the last 3-4 months, has become quite weak.  Pt has been little he wheelchair-bound for the last 1 month per MD note, has not been eating or drinking well and has lost about 22 pounds, today he was sitting in his wheelchair and slumped forwards and sustained a fall, he was then brought to the ER where he had an extensive workup of imaging revealing and L-spine compression deformity of unclear date, according to the daughter he's been having low back pain for several months after her previous fall, he also had head and neck CT scan which was unremarkable except for a thyroid nodule , his lab work was suggestive of UTI, acute  renal failure, he also had metabolic encephalopathy.  SLP visit today to assess tolerance of po diet and readiness for advancement.    Pertinent Vitals Afebrile, decreased  SLP Plan  All goals met    Recommendations Diet recommendations: Dysphagia 3 (mechanical soft);Thin liquid Liquids provided via: Cup;Straw Medication Administration: Whole meds with puree Supervision: Patient able to self feed Compensations: Slow rate;Small sips/bites (check for oral residuals after meals) Postural Changes and/or Swallow Maneuvers: Seated upright 90 degrees;Upright 30-60 min after meal              Oral Care Recommendations: Oral care BID Follow up Recommendations: None Plan: All goals met    [place], [facility]

## 2013-08-01 NOTE — ED Provider Notes (Signed)
Medical screening examination/treatment/procedure(s) were conducted as a shared visit with non-physician practitioner(s) and myself.  I personally evaluated the patient during the encounter.  EKG Interpretation    Date/Time:  Sunday July 29 2013 22:06:58 EST Ventricular Rate:  69 PR Interval:  35 QRS Duration: 146 QT Interval:  459 QTC Calculation: 492 R Axis:   -95 Text Interpretation:  Sinus rhythm Short PR interval Left atrial enlargement RBBB and LAFB Anterolateral infarct, age indeterminate No old tracing to compare Confirmed by Virga Haltiwanger  MD, Nema Oatley ([id number]) on 07/29/2013 10:11:36 PM            Patient with unwitnessed fall and progressive decline. Given his increased Cr, UTI, will need IV abx and hydration. Will admit to hospitalist.  Audree CamelScott T Ila Landowski, MD 08/01/13 1309

## 2013-08-01 NOTE — Progress Notes (Signed)
Recd call from Dr Leanor RubensteinHaber, pts memory care/community dr. She informed me that they would like to see Ricardo Edwards return today per the families wishes. She would like to talk directly with pts current attending. Her number is [phone], informed dr. Izola PriceMyers.

## 2013-08-01 NOTE — Discharge Instructions (Signed)
Urinary Tract Infection  Urinary tract infections (UTIs) can develop anywhere along your urinary tract. Your urinary tract is your body's drainage system for removing wastes and extra water. Your urinary tract includes two kidneys, two ureters, a bladder, and a urethra. Your kidneys are a pair of bean-shaped organs. Each kidney is about the size of your fist. They are located below your ribs, one on each side of your spine.  CAUSES  Infections are caused by microbes, which are microscopic organisms, including fungi, viruses, and bacteria. These organisms are so small that they can only be seen through a microscope. Bacteria are the microbes that most commonly cause UTIs.  SYMPTOMS   Symptoms of UTIs may vary by age and gender of the patient and by the location of the infection. Symptoms in young women typically include a frequent and intense urge to urinate and a painful, burning feeling in the bladder or urethra during urination. Older women and men are more likely to be tired, shaky, and weak and have muscle aches and abdominal pain. A fever may mean the infection is in your kidneys. Other symptoms of a kidney infection include pain in your back or sides below the ribs, nausea, and vomiting.  DIAGNOSIS  To diagnose a UTI, your caregiver will ask you about your symptoms. Your caregiver also will ask to provide a urine sample. The urine sample will be tested for bacteria and white blood cells. White blood cells are made by your body to help fight infection.  TREATMENT   Typically, UTIs can be treated with medication. Because most UTIs are caused by a bacterial infection, they usually can be treated with the use of antibiotics. The choice of antibiotic and length of treatment depend on your symptoms and the type of bacteria causing your infection.  HOME CARE INSTRUCTIONS   If you were prescribed antibiotics, take them exactly as your caregiver instructs you. Finish the medication even if you feel better after you  have only taken some of the medication.   Drink enough water and fluids to keep your urine clear or pale yellow.   Avoid caffeine, tea, and carbonated beverages. They tend to irritate your bladder.   Empty your bladder often. Avoid holding urine for long periods of time.   Empty your bladder before and after sexual intercourse.   After a bowel movement, women should cleanse from front to back. Use each tissue only once.  SEEK MEDICAL CARE IF:    You have back pain.   You develop a fever.   Your symptoms do not begin to resolve within 3 days.  SEEK IMMEDIATE MEDICAL CARE IF:    You have severe back pain or lower abdominal pain.   You develop chills.   You have nausea or vomiting.   You have continued burning or discomfort with urination.  MAKE SURE YOU:    Understand these instructions.   Will watch your condition.   Will get help right away if you are not doing well or get worse.  Document Released: 04/07/2005 Document Revised: 12/28/2011 Document Reviewed: 08/06/2011  [employer] Patient Information 2014 [employer]

## 2013-08-02 LAB — CULTURE, BLOOD (ROUTINE X 2)

## 2013-08-05 LAB — CULTURE, BLOOD (ROUTINE X 2): Culture: NO GROWTH

## 2013-09-16 ENCOUNTER — Emergency Department (HOSPITAL_COMMUNITY): Payer: Medicare Other

## 2013-09-16 ENCOUNTER — Emergency Department (HOSPITAL_COMMUNITY)
Admission: EM | Admit: 2013-09-16 | Discharge: 2013-09-16 | Disposition: A | Discharge status: Home / Self Care | Payer: Medicare Other | Attending: Emergency Medicine | Admitting: Emergency Medicine

## 2013-09-16 ENCOUNTER — Encounter (HOSPITAL_COMMUNITY): Payer: Self-pay | Admitting: Emergency Medicine

## 2013-09-16 DIAGNOSIS — W19XXXA Unspecified fall, initial encounter: Secondary | ICD-10-CM

## 2013-09-16 DIAGNOSIS — S0990XA Unspecified injury of head, initial encounter: Secondary | ICD-10-CM | POA: Insufficient documentation

## 2013-09-16 DIAGNOSIS — Z880 Allergy status to penicillin: Secondary | ICD-10-CM | POA: Insufficient documentation

## 2013-09-16 DIAGNOSIS — M1990 Unspecified osteoarthritis, unspecified site: Secondary | ICD-10-CM | POA: Insufficient documentation

## 2013-09-16 DIAGNOSIS — I10 Essential (primary) hypertension: Secondary | ICD-10-CM | POA: Insufficient documentation

## 2013-09-16 DIAGNOSIS — Z8719 Personal history of other diseases of the digestive system: Secondary | ICD-10-CM | POA: Insufficient documentation

## 2013-09-16 DIAGNOSIS — F4310 Post-traumatic stress disorder, unspecified: Secondary | ICD-10-CM | POA: Insufficient documentation

## 2013-09-16 DIAGNOSIS — F3289 Other specified depressive episodes: Secondary | ICD-10-CM | POA: Insufficient documentation

## 2013-09-16 DIAGNOSIS — IMO0002 Reserved for concepts with insufficient information to code with codable children: Secondary | ICD-10-CM | POA: Insufficient documentation

## 2013-09-16 DIAGNOSIS — H9190 Unspecified hearing loss, unspecified ear: Secondary | ICD-10-CM | POA: Insufficient documentation

## 2013-09-16 DIAGNOSIS — F0390 Unspecified dementia without behavioral disturbance: Secondary | ICD-10-CM | POA: Insufficient documentation

## 2013-09-16 DIAGNOSIS — Z87448 Personal history of other diseases of urinary system: Secondary | ICD-10-CM | POA: Insufficient documentation

## 2013-09-16 DIAGNOSIS — Z862 Personal history of diseases of the blood and blood-forming organs and certain disorders involving the immune mechanism: Secondary | ICD-10-CM | POA: Insufficient documentation

## 2013-09-16 DIAGNOSIS — F329 Major depressive disorder, single episode, unspecified: Secondary | ICD-10-CM | POA: Insufficient documentation

## 2013-09-16 DIAGNOSIS — Z79899 Other long term (current) drug therapy: Secondary | ICD-10-CM | POA: Insufficient documentation

## 2013-09-16 DIAGNOSIS — Y9301 Activity, walking, marching and hiking: Secondary | ICD-10-CM | POA: Insufficient documentation

## 2013-09-16 DIAGNOSIS — W010XXA Fall on same level from slipping, tripping and stumbling without subsequent striking against object, initial encounter: Secondary | ICD-10-CM | POA: Insufficient documentation

## 2013-09-16 DIAGNOSIS — Y9210 Unspecified residential institution as the place of occurrence of the external cause: Secondary | ICD-10-CM | POA: Insufficient documentation

## 2013-09-16 DIAGNOSIS — Z8639 Personal history of other endocrine, nutritional and metabolic disease: Secondary | ICD-10-CM | POA: Insufficient documentation

## 2013-09-16 LAB — BASIC METABOLIC PANEL
BUN: 25 mg/dL — ABNORMAL HIGH (ref 6–23)
CO2: 28 mEq/L (ref 19–32)
Calcium: 8.8 mg/dL (ref 8.4–10.5)
Chloride: 101 mEq/L (ref 96–112)
Creatinine, Ser: 1.14 mg/dL (ref 0.50–1.35)
GFR calc Af Amer: 64 mL/min — ABNORMAL LOW (ref >90)
GFR calc non Af Amer: 55 mL/min — ABNORMAL LOW (ref >90)
Glucose, Bld: 118 mg/dL — ABNORMAL HIGH (ref 70–99)
Potassium: 3.2 mEq/L — ABNORMAL LOW (ref 3.7–5.3)
Sodium: 143 mEq/L (ref 137–147)

## 2013-09-16 LAB — CBC
HCT: 35.2 % — ABNORMAL LOW (ref 39.0–52.0)
Hemoglobin: 11.3 g/dL — ABNORMAL LOW (ref 13.0–17.0)
MCH: 29.6 pg (ref 26.0–34.0)
MCHC: 32.1 g/dL (ref 30.0–36.0)
MCV: 92.1 fL (ref 78.0–100.0)
Platelets: 218 10*3/uL (ref 150–400)
RBC: 3.82 MIL/uL — ABNORMAL LOW (ref 4.22–5.81)
RDW: 17 % — ABNORMAL HIGH (ref 11.5–15.5)
WBC: 7.2 10*3/uL (ref 4.0–10.5)

## 2013-09-16 LAB — URINALYSIS, ROUTINE W REFLEX MICROSCOPIC
Bilirubin Urine: NEGATIVE
Glucose, UA: NEGATIVE mg/dL
Hgb urine dipstick: NEGATIVE
Ketones, ur: NEGATIVE mg/dL
Leukocytes, UA: NEGATIVE
Nitrite: NEGATIVE
Protein, ur: NEGATIVE mg/dL
Specific Gravity, Urine: 1.011 (ref 1.005–1.030)
Urobilinogen, UA: 1 mg/dL (ref 0.0–1.0)
pH: 6 (ref 5.0–8.0)

## 2013-09-16 NOTE — ED Provider Notes (Signed)
Level V caveat dementia. Patient reportedly slipped and fell today while walking. He presently offers no complaint. Denies pain anywhere. HEENT exam atraumatic neck nontender chest abdomen pelvis nontender all 4 extremities without contusion abrasion or tenderness neurovascular intact  Doug SouSam Olyver Hawes, MD 09/16/13 2031

## 2013-09-16 NOTE — ED Notes (Signed)
Report given to Elease HashimotoPatricia at [facility].

## 2013-09-16 NOTE — ED Notes (Signed)
[facility] alert[facility] c/o fall, occurred this am, trip on carpet, no loc, pt denies c/o, resp even unlabored, skin pwd

## 2013-09-16 NOTE — ED Notes (Signed)
Spoke w/ [place] communication to arrange for non emergent transfer back to SNF.

## 2013-09-16 NOTE — ED Provider Notes (Signed)
CSN: [id number]     Arrival date & time 09/16/13  1838 History   First MD Initiated Contact with Patient 09/16/13 1857     Chief Complaint  Patient presents with  . Fall    Earlier today, witnessed by staff, denies LOC, pt hx dementia     (Consider location/radiation/quality/duration/timing/severity/associated sxs/prior Treatment) HPI Patient presents to the emergency department following a fall that occurred today.  Patient, states he tripped, fell, hitting his head.  Patient does not have any complaints of pain at this time.  Patient, states he has an abrasion to his left elbow.  Patient denies chest pain, shortness of breath, loss of consciousness, nausea, vomiting, diarrhea, headache, blurred vision, weakness, dizziness.  Patient does not accurately recall all the events surrounding the fall Past Medical History  Diagnosis Date  . Hearing loss   . Dementia 2013  . PTSD (post-traumatic stress disorder)   . Hyperlipidemia   . GERD (gastroesophageal reflux disease)   . Allergic rhinitis due to pollen   . Osteoarthrosis involving, or with mention of more than one site, but not specified as generalized, multiple sites   . HTN (hypertension), benign   . Depression   . BPH (benign prostatic hypertrophy)    Past Surgical History  Procedure Laterality Date  . Appendectomy    . Ear lobe    . Cataract extraction    . Nose surgery    . Tonsillectomy     Family History  Problem Relation Age of Onset  . Diabetes Father   . Hypertension Father   . Gout      NOT KNOWN   History  Substance Use Topics  . Smoking status: Never Smoker   . Smokeless tobacco: Never Used  . Alcohol Use: No    Review of Systems Level V caveat applies due to dementia  Allergies  Metoprolol and Penicillins  Home Medications   Current Outpatient Rx  Name  Route  Sig  Dispense  Refill  . acetaminophen (TYLENOL) 500 MG tablet   Oral   Take 1,000 mg by mouth 3 (three) times daily as needed for pain.           Marland Kitchen. amLODipine (NORVASC) 10 MG tablet   Oral   Take 10 mg by mouth at bedtime.          Marland Kitchen. lisinopril-hydrochlorothiazide (PRINZIDE,ZESTORETIC) 20-25 MG per tablet   Oral   Take 1 tablet by mouth every morning.         Marland Kitchen. LORazepam (ATIVAN) 0.5 MG tablet   Oral   Take 0.5 mg by mouth See admin instructions. Take 1 tab once daily at 1pm         . LORazepam (ATIVAN) 0.5 MG tablet   Oral   Take 0.5 mg by mouth every 4 (four) hours as needed for anxiety.         . Melatonin 1 MG TABS   Oral   Take 1 tablet by mouth at bedtime as needed (sleep).         . memantine (NAMENDA) 10 MG tablet   Oral   Take 10 mg by mouth 2 (two) times daily.         . mirtazapine (REMERON) 30 MG tablet   Oral   Take 30 mg by mouth at bedtime.         . multivitamin-lutein (OCUVITE-LUTEIN) CAPS   Oral   Take 1 capsule by mouth 2 (two) times daily.         .Marland Kitchen  polyethylene glycol (MIRALAX / GLYCOLAX) packet   Oral   Take 17 g by mouth daily as needed for mild constipation.          BP 101/70  Pulse 76  Temp(Src) 98.2 F (36.8 C) (Oral)  Resp 16  Wt 210 lb (95.255 kg)  SpO2 98% Physical Exam  Constitutional: He appears well-developed and well-nourished. No distress.  HENT:  Head: Normocephalic and atraumatic.  Mouth/Throat: Oropharynx is clear and moist.  Eyes: Pupils are equal, round, and reactive to light.  Neck: Normal range of motion. Neck supple.  Cardiovascular: Normal rate and regular rhythm.  Exam reveals no gallop and no friction rub.   No murmur heard. Pulmonary/Chest: Effort normal and breath sounds normal. No respiratory distress.  Musculoskeletal:  Patient has an abrasion to his left elbow  Neurological: He is alert. He has normal reflexes. He exhibits normal muscle tone. Coordination normal.  Skin: Skin is warm and dry.    ED Course  Procedures (including critical care time) Labs Review Labs Reviewed  BASIC METABOLIC PANEL - Abnormal; Notable  for the following:    Potassium 3.2 (*)    Glucose, Bld 118 (*)    BUN 25 (*)    GFR calc non Af Amer 55 (*)    GFR calc Af Amer 64 (*)    All other components within normal limits  CBC - Abnormal; Notable for the following:    RBC 3.82 (*)    Hemoglobin 11.3 (*)    HCT 35.2 (*)    RDW 17.0 (*)    All other components within normal limits  URINALYSIS, ROUTINE W REFLEX MICROSCOPIC   Imaging Review Dg Elbow Complete Left  09/16/2013   CLINICAL DATA:  Followup. Left elbow pain.  EXAM: LEFT ELBOW - COMPLETE 3+ VIEW  COMPARISON:  None.  FINDINGS: No fracture. The joint is normally aligned. No joint effusion. Soft tissues are unremarkable.  IMPRESSION: No fracture or dislocation.   Electronically Signed   By: Amie Portland M.D.   On: 09/16/2013 20:16   Dg Hip Complete Left  09/16/2013   CLINICAL DATA:  Fall with left hip pain.  EXAM: LEFT HIP - COMPLETE 2+ VIEW  COMPARISON:  DG SACRUM/COCCYX dated 07/29/2013  FINDINGS: Axial loss of articular space in both hips. Vascular calcifications. Lower lumbar spondylosis.  Acetabular spurring noted. No hip fracture is radiographically apparent. No definite bony pelvic fracture.  IMPRESSION: 1. No definite hip fracture or bony pelvic fracture. 2. Degenerative arthropathy of the hips. 3. Lower lumbar spondylosis.   Electronically Signed   By: Herbie Baltimore M.D.   On: 09/16/2013 20:14   Ct Head Wo Contrast  09/16/2013   CLINICAL DATA:  Fall this morning. Neck pain and headache. History of dementia.  EXAM: CT HEAD WITHOUT CONTRAST  CT CERVICAL SPINE WITHOUT CONTRAST  TECHNIQUE: Multidetector CT imaging of the head and cervical spine was performed following the standard protocol without intravenous contrast. Multiplanar CT image reconstructions of the cervical spine were also generated.  COMPARISON:  None.  FINDINGS: CT HEAD FINDINGS  Ventricles are normal in configuration. There is ventricular and sulcal enlargement reflecting moderate atrophy. No parenchymal  masses or mass effect. No evidence of a cortical infarct. There is mild periventricular white matter hypoattenuation most consistent with chronic microvascular ischemic change. No extra-axial masses or abnormal fluid collections. There is no intracranial hemorrhage. No skull fracture. Visualized sinuses and mastoid air cells are clear.  CT CERVICAL SPINE FINDINGS  No fracture  or spondylolisthesis. There are stable disc and facet degenerative changes.  Previously described thyroid nodule is stable. Neck soft tissues are otherwise unremarkable. Lung apices are clear.  IMPRESSION: HEAD CT: No acute intracranial abnormalities. Atrophy and mild chronic microvascular ischemic change.  CERVICAL CT:  No fracture or acute finding.   Electronically Signed   By: Amie Portland M.D.   On: 09/16/2013 20:03   Ct Cervical Spine Wo Contrast  09/16/2013   CLINICAL DATA:  Fall this morning. Neck pain and headache. History of dementia.  EXAM: CT HEAD WITHOUT CONTRAST  CT CERVICAL SPINE WITHOUT CONTRAST  TECHNIQUE: Multidetector CT imaging of the head and cervical spine was performed following the standard protocol without intravenous contrast. Multiplanar CT image reconstructions of the cervical spine were also generated.  COMPARISON:  None.  FINDINGS: CT HEAD FINDINGS  Ventricles are normal in configuration. There is ventricular and sulcal enlargement reflecting moderate atrophy. No parenchymal masses or mass effect. No evidence of a cortical infarct. There is mild periventricular white matter hypoattenuation most consistent with chronic microvascular ischemic change. No extra-axial masses or abnormal fluid collections. There is no intracranial hemorrhage. No skull fracture. Visualized sinuses and mastoid air cells are clear.  CT CERVICAL SPINE FINDINGS  No fracture or spondylolisthesis. There are stable disc and facet degenerative changes.  Previously described thyroid nodule is stable. Neck soft tissues are otherwise unremarkable.  Lung apices are clear.  IMPRESSION: HEAD CT: No acute intracranial abnormalities. Atrophy and mild chronic microvascular ischemic change.  CERVICAL CT:  No fracture or acute finding.   Electronically Signed   By: Amie Portland M.D.   On: 09/16/2013 20:03    Patient is stable.  Will be discharged back to the facility is x-rays and CT scans reviewed in no acute findings were noted.  Patient will be advised to followup with primary care Dr.  Carlyle Dolly, PA-C 09/17/13 0109

## 2013-09-16 NOTE — Discharge Instructions (Signed)
Followup with his primary care Dr. return here as needed

## 2013-09-19 NOTE — ED Provider Notes (Signed)
Medical screening examination/treatment/procedure(s) were conducted as a shared visit with non-physician practitioner(s) and myself.  I personally evaluated the patient during the encounter.   EKG Interpretation None       Charletha Dalpe, MD 09/19/13 0850 

## 2013-10-23 ENCOUNTER — Emergency Department (HOSPITAL_COMMUNITY)
Admission: EM | Admit: 2013-10-23 | Discharge: 2013-10-23 | Disposition: A | Discharge status: Home / Self Care | Payer: Medicare Other | Attending: Emergency Medicine | Admitting: Emergency Medicine

## 2013-10-23 ENCOUNTER — Encounter (HOSPITAL_COMMUNITY): Payer: Self-pay | Admitting: Emergency Medicine

## 2013-10-23 ENCOUNTER — Emergency Department (HOSPITAL_COMMUNITY): Payer: Medicare Other

## 2013-10-23 DIAGNOSIS — Z862 Personal history of diseases of the blood and blood-forming organs and certain disorders involving the immune mechanism: Secondary | ICD-10-CM | POA: Insufficient documentation

## 2013-10-23 DIAGNOSIS — W07XXXA Fall from chair, initial encounter: Secondary | ICD-10-CM | POA: Insufficient documentation

## 2013-10-23 DIAGNOSIS — W19XXXA Unspecified fall, initial encounter: Secondary | ICD-10-CM

## 2013-10-23 DIAGNOSIS — Y9389 Activity, other specified: Secondary | ICD-10-CM | POA: Insufficient documentation

## 2013-10-23 DIAGNOSIS — M1990 Unspecified osteoarthritis, unspecified site: Secondary | ICD-10-CM | POA: Insufficient documentation

## 2013-10-23 DIAGNOSIS — Z8639 Personal history of other endocrine, nutritional and metabolic disease: Secondary | ICD-10-CM | POA: Insufficient documentation

## 2013-10-23 DIAGNOSIS — Z880 Allergy status to penicillin: Secondary | ICD-10-CM | POA: Insufficient documentation

## 2013-10-23 DIAGNOSIS — IMO0002 Reserved for concepts with insufficient information to code with codable children: Secondary | ICD-10-CM

## 2013-10-23 DIAGNOSIS — S51009A Unspecified open wound of unspecified elbow, initial encounter: Secondary | ICD-10-CM | POA: Insufficient documentation

## 2013-10-23 DIAGNOSIS — Y9210 Unspecified residential institution as the place of occurrence of the external cause: Secondary | ICD-10-CM | POA: Insufficient documentation

## 2013-10-23 DIAGNOSIS — F4310 Post-traumatic stress disorder, unspecified: Secondary | ICD-10-CM | POA: Insufficient documentation

## 2013-10-23 DIAGNOSIS — H9190 Unspecified hearing loss, unspecified ear: Secondary | ICD-10-CM | POA: Insufficient documentation

## 2013-10-23 DIAGNOSIS — F329 Major depressive disorder, single episode, unspecified: Secondary | ICD-10-CM | POA: Insufficient documentation

## 2013-10-23 DIAGNOSIS — Z87448 Personal history of other diseases of urinary system: Secondary | ICD-10-CM | POA: Insufficient documentation

## 2013-10-23 DIAGNOSIS — F3289 Other specified depressive episodes: Secondary | ICD-10-CM | POA: Insufficient documentation

## 2013-10-23 DIAGNOSIS — I10 Essential (primary) hypertension: Secondary | ICD-10-CM | POA: Insufficient documentation

## 2013-10-23 DIAGNOSIS — Z8719 Personal history of other diseases of the digestive system: Secondary | ICD-10-CM | POA: Insufficient documentation

## 2013-10-23 DIAGNOSIS — F0390 Unspecified dementia without behavioral disturbance: Secondary | ICD-10-CM | POA: Insufficient documentation

## 2013-10-23 DIAGNOSIS — Z79899 Other long term (current) drug therapy: Secondary | ICD-10-CM | POA: Insufficient documentation

## 2013-10-23 LAB — URINALYSIS, ROUTINE W REFLEX MICROSCOPIC
Bilirubin Urine: NEGATIVE
Glucose, UA: NEGATIVE mg/dL
Hgb urine dipstick: NEGATIVE
KETONES UR: NEGATIVE mg/dL
Leukocytes, UA: NEGATIVE
Nitrite: NEGATIVE
Protein, ur: NEGATIVE mg/dL
SPECIFIC GRAVITY, URINE: 1.016 (ref 1.005–1.030)
Urobilinogen, UA: 1 mg/dL (ref 0.0–1.0)
pH: 6.5 (ref 5.0–8.0)

## 2013-10-23 NOTE — ED Notes (Signed)
Pt states his back and head hurts, unable to tell if he fell or how he fell, pt is confused, knows name, Pt has skin tear to L elbow, also L ankle is swollen (unknown if this is new).

## 2013-10-23 NOTE — ED Notes (Signed)
[facility] called to transport pt back to [facility]

## 2013-10-23 NOTE — ED Notes (Signed)
Per EMS pt from [facility], memory care unit, pt slipped out of chair, pt has skin tear below L elbow, there is a blood trail from chair to floor, so unsure if slipped out of chair, pt states he hurts all over, per nurse at facility he hurts all the time. BP 155/57, 100% RA, HR 70.

## 2013-10-23 NOTE — ED Provider Notes (Signed)
CSN: [id number]     Arrival date & time 10/23/13  0456 History   First MD Initiated Contact with Patient 10/23/13 0503     Chief Complaint  Patient presents with  . Fall     (Consider location/radiation/quality/duration/timing/severity/associated sxs/prior Treatment) HPI  This is an 80 year old male who presents from [facility] memory unit following a fall. Patient has a history of dementia. He is oriented x1 on exam. Per report, he was last seen at 1:30. He was found at approximately 4:00 AM with a skin tear to the left elbow. Patient reports that he thinks he may have fallen. He reports pain all over. Patient also reports that he may have lost consciousness but he is unsure.  Level V caveat for altered mental status and dementia  Past Medical History  Diagnosis Date  . Hearing loss   . Dementia 2013  . PTSD (post-traumatic stress disorder)   . Hyperlipidemia   . GERD (gastroesophageal reflux disease)   . Allergic rhinitis due to pollen   . Osteoarthrosis involving, or with mention of more than one site, but not specified as generalized, multiple sites   . HTN (hypertension), benign   . Depression   . BPH (benign prostatic hypertrophy)    Past Surgical History  Procedure Laterality Date  . Appendectomy    . Ear lobe    . Cataract extraction    . Nose surgery    . Tonsillectomy     Family History  Problem Relation Age of Onset  . Diabetes Father   . Hypertension Father   . Gout      NOT KNOWN   History  Substance Use Topics  . Smoking status: Never Smoker   . Smokeless tobacco: Never Used  . Alcohol Use: No    Review of Systems  Unable to perform ROS: Dementia      Allergies  Metoprolol and Penicillins  Home Medications   Current Outpatient Rx  Name  Route  Sig  Dispense  Refill  . acetaminophen (TYLENOL) 500 MG tablet   Oral   Take 1,000 mg by mouth 3 (three) times daily as needed for pain.          Marland Kitchen amLODipine (NORVASC) 10 MG tablet    Oral   Take 10 mg by mouth at bedtime.          Marland Kitchen lisinopril-hydrochlorothiazide (PRINZIDE,ZESTORETIC) 20-25 MG per tablet   Oral   Take 1 tablet by mouth every morning.         . loperamide (IMODIUM) 2 MG capsule   Oral   Take 2 mg by mouth as needed for diarrhea or loose stools (diarrhea).         . LORazepam (ATIVAN) 0.5 MG tablet   Oral   Take 0.5 mg by mouth See admin instructions. Take 1 tab once daily at 1pm         . LORazepam (ATIVAN) 0.5 MG tablet   Oral   Take 0.25 mg by mouth every 4 (four) hours as needed for anxiety (anxiety).          . Melatonin 1 MG TABS   Oral   Take 1 tablet by mouth at bedtime as needed (sleep).         . memantine (NAMENDA) 10 MG tablet   Oral   Take 10 mg by mouth 2 (two) times daily.         . mirtazapine (REMERON) 30 MG tablet   Oral  Take 30 mg by mouth at bedtime.         . multivitamin-lutein (OCUVITE-LUTEIN) CAPS   Oral   Take 1 capsule by mouth 2 (two) times daily.         . polyethylene glycol (MIRALAX / GLYCOLAX) packet   Oral   Take 17 g by mouth daily as needed for mild constipation.          BP 168/54  Pulse 70  Temp(Src) 98 F (36.7 C) (Oral)  Resp 16  SpO2 100% Physical Exam  Nursing note and vitals reviewed. Constitutional: No distress.  Elderly  HENT:  Head: Normocephalic and atraumatic.  Mouth/Throat: Oropharynx is clear and moist.  Eyes: Pupils are equal, round, and reactive to light.  Neck: Neck supple.  No midline cervical spine tenderness, step-off, or deformity  Cardiovascular: Normal rate, regular rhythm and normal heart sounds.   No murmur heard. Pulmonary/Chest: Effort normal and breath sounds normal. No respiratory distress. He has no wheezes. He has no rales.  Abdominal: Soft. Bowel sounds are normal. There is no tenderness. There is no rebound.  Genitourinary: Penis normal.  Musculoskeletal: He exhibits no edema.  Normal range of motion of the bilateral hips and knees,  no obvious deformity, tenderness palpation over the left lateral elbow with associated skin tear approximately the size of a quarter  Lymphadenopathy:    He has no cervical adenopathy.  Neurological: He is alert.  Oriented x2, moves all 4 extremities and follows simple commands  Skin: Skin is warm and dry.  Skin tear as described above  Psychiatric: He has a normal mood and affect.    ED Course  Procedures (including critical care time) Labs Review Labs Reviewed  URINALYSIS, ROUTINE W REFLEX MICROSCOPIC   Imaging Review Dg Elbow Complete Left  10/23/2013   CLINICAL DATA:  Status post fall this morning  EXAM: LEFT ELBOW - COMPLETE 3+ VIEW  COMPARISON:  DG ELBOW COMPLETE*L* dated 09/16/2013  FINDINGS: There is no evidence of fracture, dislocation, or joint effusion. There is no evidence of arthropathy or other focal bone abnormality. Soft tissues are unremarkable.  IMPRESSION: Negative.   Electronically Signed   By: Elige Ko   On: 10/23/2013 05:38   Ct Head Wo Contrast  10/23/2013   CLINICAL DATA:  Patient fell out of the chair this morning. Multiple prior falls.  EXAM: CT HEAD WITHOUT CONTRAST  CT CERVICAL SPINE WITHOUT CONTRAST  TECHNIQUE: Multidetector CT imaging of the head and cervical spine was performed following the standard protocol without intravenous contrast. Multiplanar CT image reconstructions of the cervical spine were also generated.  COMPARISON:  CT HEAD W/O CM dated 09/16/2013; CT C SPINE W/O CM dated 09/16/2013; CT HEAD W/O CM dated 07/29/2013; CT C SPINE W/O CM dated 07/29/2013  FINDINGS: CT HEAD FINDINGS  There is no evidence of mass effect, midline shift or extra-axial fluid collections. There is no evidence of a space-occupying lesion or intracranial hemorrhage. There is no evidence of a cortical-based area of acute infarction. There is generalized cerebral atrophy. There is periventricular white matter low attenuation likely secondary to microangiopathy.  The ventricles and  sulci are appropriate for the patient's age. The basal cisterns are patent.  Visualized portions of the orbits are unremarkable. The visualized portions of the paranasal sinuses and mastoid air cells are unremarkable.  The osseous structures are unremarkable. There is cerebrovascular atherosclerosis.  CT CERVICAL SPINE FINDINGS  The alignment is anatomic. The vertebral body heights are maintained. There is  no acute fracture. There is no static listhesis. The prevertebral soft tissues are normal. The intraspinal soft tissues are not fully imaged on this examination due to poor soft tissue contrast, but there is no gross soft tissue abnormality.  There is mild degenerative disc disease that C5-6 and C6-7. There is bilateral facet arthropathy at C2-3, C3-4, C4-5, C5-6 and C6-7, left greater than right. There is bilateral foraminal stenosis at C3-4.  The visualized portions of the lung apices demonstrate no focal abnormality.There is bilateral carotid artery atherosclerosis.  IMPRESSION: 1. No acute intracranial pathology. 2. No acute osseous injury of the cervical spine. 3. Cervical spine spondylosis as described above.   Electronically Signed   By: Elige KoHetal  Patel   On: 10/23/2013 05:53   Ct Cervical Spine Wo Contrast  10/23/2013   CLINICAL DATA:  Patient fell out of the chair this morning. Multiple prior falls.  EXAM: CT HEAD WITHOUT CONTRAST  CT CERVICAL SPINE WITHOUT CONTRAST  TECHNIQUE: Multidetector CT imaging of the head and cervical spine was performed following the standard protocol without intravenous contrast. Multiplanar CT image reconstructions of the cervical spine were also generated.  COMPARISON:  CT HEAD W/O CM dated 09/16/2013; CT C SPINE W/O CM dated 09/16/2013; CT HEAD W/O CM dated 07/29/2013; CT C SPINE W/O CM dated 07/29/2013  FINDINGS: CT HEAD FINDINGS  There is no evidence of mass effect, midline shift or extra-axial fluid collections. There is no evidence of a space-occupying lesion or intracranial  hemorrhage. There is no evidence of a cortical-based area of acute infarction. There is generalized cerebral atrophy. There is periventricular white matter low attenuation likely secondary to microangiopathy.  The ventricles and sulci are appropriate for the patient's age. The basal cisterns are patent.  Visualized portions of the orbits are unremarkable. The visualized portions of the paranasal sinuses and mastoid air cells are unremarkable.  The osseous structures are unremarkable. There is cerebrovascular atherosclerosis.  CT CERVICAL SPINE FINDINGS  The alignment is anatomic. The vertebral body heights are maintained. There is no acute fracture. There is no static listhesis. The prevertebral soft tissues are normal. The intraspinal soft tissues are not fully imaged on this examination due to poor soft tissue contrast, but there is no gross soft tissue abnormality.  There is mild degenerative disc disease that C5-6 and C6-7. There is bilateral facet arthropathy at C2-3, C3-4, C4-5, C5-6 and C6-7, left greater than right. There is bilateral foraminal stenosis at C3-4.  The visualized portions of the lung apices demonstrate no focal abnormality.There is bilateral carotid artery atherosclerosis.  IMPRESSION: 1. No acute intracranial pathology. 2. No acute osseous injury of the cervical spine. 3. Cervical spine spondylosis as described above.   Electronically Signed   By: Elige KoHetal  Patel   On: 10/23/2013 05:53     EKG Interpretation   Date/Time:  Tuesday October 23 2013 05:16:56 EDT Ventricular Rate:  65 PR Interval:  202 QRS Duration: 151 QT Interval:  455 QTC Calculation: 473 R Axis:   -44 Text Interpretation:  Sinus rhythm RBBB and LAFB Abnormal lateral Q waves  No significant change since last tracing Confirmed by HORTON  MD, Toni AmendOURTNEY  ([id number]) on 10/23/2013 5:20:14 AM      MDM   Final diagnoses:  Fall  Skin tear    Patient presents following a presumed fall. History is limited secondary to  dementia. Only evidence of trauma is a skin tear to the left elbow. EKG is nonischemic and without evidence of arrhythmia. CT head and  neck are negative. Plain films are negative. Patient does not have any evidence of UTI. Patient will be discharged back to his living facility. He ambulated at his baseline prior to discharge.  After history, exam, and medical workup I feel the patient has been appropriately medically screened and is safe for discharge home. Pertinent diagnoses were discussed with the patient. Patient was given return precautions.     Shon Batonourtney F Horton, MD 10/23/13 [phone]

## 2013-10-23 NOTE — ED Notes (Signed)
Patient transported to CT 

## 2013-10-23 NOTE — ED Notes (Signed)
Bed: WA03 Expected date: 10/23/13 Expected time: 4:39 AM Means of arrival: Ambulance Comments: Fall, abrasion

## 2013-10-23 NOTE — Discharge Instructions (Signed)
Skin Tear Care  A skin tear is a wound in which the top layer of skin has peeled off. This is a common problem with aging because the skin becomes thinner and more fragile as a person gets older. In addition, some medicines, such as oral corticosteroids, can lead to skin thinning if taken for long periods of time.   A skin tear is often repaired with tape or skin adhesive strips. This keeps the skin that has been peeled off in contact with the healthier skin beneath. Depending on the location of the wound, a bandage (dressing) may be applied over the tape or skin adhesive strips. Sometimes, during the healing process, the skin turns black and dies. Even when this happens, the torn skin acts as a good dressing until the skin underneath gets healthier and repairs itself.  HOME CARE INSTRUCTIONS   · Change dressings once per day or as directed by your caregiver.  · Gently clean the skin tear and the area around the tear using saline solution or mild soap and water.  · Do not rub the injured skin dry. Let the area air dry.  · Apply petroleum jelly or an antibiotic cream or ointment to keep the tear moist. This will help the wound heal. Do not allow a scab to form.  · If the dressing sticks before the next dressing change, moisten it with warm soapy water and gently remove it.  · Protect the injured skin until it has healed.  · Only take over-the-counter or prescription medicines as directed by your caregiver.  · Take showers or baths using warm soapy water. Apply a new dressing after the shower or bath.  · Keep all follow-up appointments as directed by your caregiver.    SEEK IMMEDIATE MEDICAL CARE IF:   · You have redness, swelling, or increasing pain in the skin tear.  · You have pus coming from the skin tear.  · You have chills.  · You have a red streak that goes away from the skin tear.  · You have a bad smell coming from the tear or dressing.  · You have a fever or persistent symptoms for more than 2 3 days.  · You  have a fever and your symptoms suddenly get worse.  MAKE SURE YOU:  · Understand these instructions.  · Will watch this condition.  · Will get help right away if your child is not doing well or gets worse.  Document Released: 03/23/2001 Document Revised: 03/22/2012 Document Reviewed: 01/10/2012  [employer]® Patient Information ©2014 [employer].

## 2013-10-23 NOTE — ED Notes (Signed)
Pt ambulated in room w/ 2 person assist, no distress or problems noted

## 2013-11-05 ENCOUNTER — Emergency Department (HOSPITAL_COMMUNITY)
Admission: EM | Admit: 2013-11-05 | Discharge: 2013-11-06 | Disposition: A | Discharge status: Home / Self Care | Payer: Medicare Other | Attending: Emergency Medicine | Admitting: Emergency Medicine

## 2013-11-05 DIAGNOSIS — M1990 Unspecified osteoarthritis, unspecified site: Secondary | ICD-10-CM | POA: Insufficient documentation

## 2013-11-05 DIAGNOSIS — Z9849 Cataract extraction status, unspecified eye: Secondary | ICD-10-CM | POA: Insufficient documentation

## 2013-11-05 DIAGNOSIS — H9190 Unspecified hearing loss, unspecified ear: Secondary | ICD-10-CM | POA: Insufficient documentation

## 2013-11-05 DIAGNOSIS — F03918 Unspecified dementia, unspecified severity, with other behavioral disturbance: Secondary | ICD-10-CM | POA: Insufficient documentation

## 2013-11-05 DIAGNOSIS — R4182 Altered mental status, unspecified: Secondary | ICD-10-CM | POA: Insufficient documentation

## 2013-11-05 DIAGNOSIS — I10 Essential (primary) hypertension: Secondary | ICD-10-CM | POA: Insufficient documentation

## 2013-11-05 DIAGNOSIS — Z880 Allergy status to penicillin: Secondary | ICD-10-CM | POA: Insufficient documentation

## 2013-11-05 DIAGNOSIS — F0391 Unspecified dementia with behavioral disturbance: Secondary | ICD-10-CM | POA: Insufficient documentation

## 2013-11-05 DIAGNOSIS — F4310 Post-traumatic stress disorder, unspecified: Secondary | ICD-10-CM | POA: Insufficient documentation

## 2013-11-05 DIAGNOSIS — Z8719 Personal history of other diseases of the digestive system: Secondary | ICD-10-CM | POA: Insufficient documentation

## 2013-11-05 DIAGNOSIS — F3289 Other specified depressive episodes: Secondary | ICD-10-CM | POA: Insufficient documentation

## 2013-11-05 DIAGNOSIS — Z79899 Other long term (current) drug therapy: Secondary | ICD-10-CM | POA: Insufficient documentation

## 2013-11-05 DIAGNOSIS — F329 Major depressive disorder, single episode, unspecified: Secondary | ICD-10-CM | POA: Insufficient documentation

## 2013-11-05 DIAGNOSIS — Z87448 Personal history of other diseases of urinary system: Secondary | ICD-10-CM | POA: Insufficient documentation

## 2013-11-05 NOTE — ED Notes (Signed)
Pt brought in by EMS from [facility] memory unit, report pt falling 3 times yesterday,agitation,  no LOC, but change from baseline in his mental status. Pt AOx2, able to follow command, lethargic and slurred speech at this time.

## 2013-11-05 NOTE — ED Notes (Signed)
Bed: [id number] Expected date: 11/05/13 Expected time: 11:24 PM Means of arrival: Ambulance Comments: Fall, no injuries/ams

## 2013-11-06 ENCOUNTER — Encounter (HOSPITAL_COMMUNITY): Payer: Self-pay | Admitting: Emergency Medicine

## 2013-11-06 ENCOUNTER — Emergency Department (HOSPITAL_COMMUNITY): Payer: Medicare Other

## 2013-11-06 LAB — URINALYSIS, ROUTINE W REFLEX MICROSCOPIC
Bilirubin Urine: NEGATIVE
GLUCOSE, UA: NEGATIVE mg/dL
Hgb urine dipstick: NEGATIVE
KETONES UR: NEGATIVE mg/dL
LEUKOCYTES UA: NEGATIVE
NITRITE: NEGATIVE
PH: 5.5 (ref 5.0–8.0)
Protein, ur: NEGATIVE mg/dL
SPECIFIC GRAVITY, URINE: 1.022 (ref 1.005–1.030)
Urobilinogen, UA: 0.2 mg/dL (ref 0.0–1.0)

## 2013-11-06 LAB — CBC
HEMATOCRIT: 41.3 % (ref 39.0–52.0)
Hemoglobin: 13 g/dL (ref 13.0–17.0)
MCH: 27.9 pg (ref 26.0–34.0)
MCHC: 31.5 g/dL (ref 30.0–36.0)
MCV: 88.6 fL (ref 78.0–100.0)
Platelets: 251 10*3/uL (ref 150–400)
RBC: 4.66 MIL/uL (ref 4.22–5.81)
RDW: 13.3 % (ref 11.5–15.5)
WBC: 8.7 10*3/uL (ref 4.0–10.5)

## 2013-11-06 LAB — COMPREHENSIVE METABOLIC PANEL
ALBUMIN: 3.7 g/dL (ref 3.5–5.2)
ALT: 11 U/L (ref 0–53)
AST: 18 U/L (ref 0–37)
Alkaline Phosphatase: 112 U/L (ref 39–117)
BUN: 22 mg/dL (ref 6–23)
CALCIUM: 9.8 mg/dL (ref 8.4–10.5)
CO2: 29 mEq/L (ref 19–32)
Chloride: 97 mEq/L (ref 96–112)
Creatinine, Ser: 1.22 mg/dL (ref 0.50–1.35)
GFR calc Af Amer: 59 mL/min — ABNORMAL LOW (ref >90)
GFR calc non Af Amer: 51 mL/min — ABNORMAL LOW (ref >90)
Glucose, Bld: 97 mg/dL (ref 70–99)
POTASSIUM: 3.5 meq/L — AB (ref 3.7–5.3)
Sodium: 140 mEq/L (ref 137–147)
TOTAL PROTEIN: 7 g/dL (ref 6.0–8.3)
Total Bilirubin: 0.4 mg/dL (ref 0.3–1.2)

## 2013-11-06 LAB — CBG MONITORING, ED: GLUCOSE-CAPILLARY: 80 mg/dL (ref 70–99)

## 2013-11-06 MED ORDER — SODIUM CHLORIDE 0.9 % IV BOLUS (SEPSIS)
1000.0000 mL | Freq: Once | INTRAVENOUS | Status: AC
  Administered 2013-11-06: 1000 mL via INTRAVENOUS

## 2013-11-06 NOTE — Discharge Instructions (Signed)
Please call your doctor for a followup appointment within 24-48 hours. When you talk to your doctor please let them know that you were seen in the emergency department and have them acquire all of your records so that they can discuss the findings with you and formulate a treatment plan to fully care for your new and ongoing problems. ° °

## 2013-11-06 NOTE — ED Provider Notes (Signed)
CSN: [id number]     Arrival date & time 11/05/13  2343 History   First MD Initiated Contact with Patient 11/06/13 0132     Chief Complaint  Patient presents with  . Altered Mental Status  . Fall     (Consider location/radiation/quality/duration/timing/severity/associated sxs/prior Treatment) HPI Comments: Demented 80 year old male presents from [facility] after having several falls in the last several days. Today he had increased agitation and family members state that he was opening his drawers and throwing out items, pushed the TV over and has been acting erratically.  There has been no fevers, no vomiting coughing or diarrhea and no rashes. He does have urinary infections in the past that caused similar symptoms. The patient has otherwise been himself this evening according to the family members.  Patient is a 80 y.o. male presenting with altered mental status and fall. The history is provided by a relative.  Altered Mental Status Fall    Past Medical History  Diagnosis Date  . Hearing loss   . Dementia 2013  . PTSD (post-traumatic stress disorder)   . Hyperlipidemia   . GERD (gastroesophageal reflux disease)   . Allergic rhinitis due to pollen   . Osteoarthrosis involving, or with mention of more than one site, but not specified as generalized, multiple sites   . HTN (hypertension), benign   . Depression   . BPH (benign prostatic hypertrophy)    Past Surgical History  Procedure Laterality Date  . Appendectomy    . Ear lobe    . Cataract extraction    . Nose surgery    . Tonsillectomy     Family History  Problem Relation Age of Onset  . Diabetes Father   . Hypertension Father   . Gout      NOT KNOWN   History  Substance Use Topics  . Smoking status: Never Smoker   . Smokeless tobacco: Never Used  . Alcohol Use: No    Review of Systems  Unable to perform ROS: Dementia      Allergies  Metoprolol and Penicillins  Home Medications    Prior to Admission medications   Medication Sig Start Date End Date Taking? Authorizing Provider  acetaminophen (TYLENOL) 500 MG tablet Take 1,000 mg by mouth 3 (three) times daily as needed for pain.     Historical Provider, MD  amLODipine (NORVASC) 10 MG tablet Take 10 mg by mouth at bedtime.     Historical Provider, MD  lisinopril-hydrochlorothiazide (PRINZIDE,ZESTORETIC) 20-25 MG per tablet Take 1 tablet by mouth every morning.    Historical Provider, MD  loperamide (IMODIUM) 2 MG capsule Take 2 mg by mouth as needed for diarrhea or loose stools (diarrhea).    Historical Provider, MD  LORazepam (ATIVAN) 0.5 MG tablet Take 0.5 mg by mouth See admin instructions. Take 1 tab once daily at 1pm    Historical Provider, MD  LORazepam (ATIVAN) 0.5 MG tablet Take 0.25 mg by mouth every 4 (four) hours as needed for anxiety (anxiety).     Historical Provider, MD  Melatonin 1 MG TABS Take 1 tablet by mouth at bedtime as needed (sleep).    Historical Provider, MD  memantine (NAMENDA) 10 MG tablet Take 10 mg by mouth 2 (two) times daily.    Historical Provider, MD  mirtazapine (REMERON) 30 MG tablet Take 30 mg by mouth at bedtime.    Historical Provider, MD  multivitamin-lutein (OCUVITE-LUTEIN) CAPS Take 1 capsule by mouth 2 (two) times daily.  Historical Provider, MD  polyethylene glycol (MIRALAX / GLYCOLAX) packet Take 17 g by mouth daily as needed for mild constipation.    Historical Provider, MD   BP 145/56  Pulse 67  Temp(Src) 97.5 F (36.4 C) (Oral)  Resp 14  SpO2 100% Physical Exam  Nursing note and vitals reviewed. Constitutional: He appears well-developed and well-nourished. No distress.  HENT:  Head: Normocephalic and atraumatic.  Mouth/Throat: Oropharynx is clear and moist. No oropharyngeal exudate.  Eyes: Conjunctivae and EOM are normal. Pupils are equal, round, and reactive to light. Right eye exhibits no discharge. Left eye exhibits no discharge. No scleral icterus.  Neck: Normal  range of motion. Neck supple. No JVD present. No thyromegaly present.  Cardiovascular: Normal rate, regular rhythm, normal heart sounds and intact distal pulses.  Exam reveals no gallop and no friction rub.   No murmur heard. Pulmonary/Chest: Effort normal and breath sounds normal. No respiratory distress. He has no wheezes. He has no rales.  Abdominal: Soft. Bowel sounds are normal. He exhibits no distension and no mass. There is no tenderness.  Musculoskeletal: Normal range of motion. He exhibits no edema and no tenderness.  Lymphadenopathy:    He has no cervical adenopathy.  Neurological: Coordination normal.  Sleeping but arousable to voice, follows commands, able to sit up in bed by himself  Skin: Skin is warm and dry. No rash noted. No erythema.  No rashes  Psychiatric: He has a normal mood and affect. His behavior is normal.    ED Course  Procedures (including critical care time) Labs Review Labs Reviewed  COMPREHENSIVE METABOLIC PANEL - Abnormal; Notable for the following:    Potassium 3.5 (*)    GFR calc non Af Amer 51 (*)    GFR calc Af Amer 59 (*)    All other components within normal limits  CBC  URINALYSIS, ROUTINE W REFLEX MICROSCOPIC  CBG MONITORING, ED    Imaging Review Dg Chest Port 1 View  11/06/2013   CLINICAL DATA:  Altered mental status. Status post fall. Shortness of breath.  EXAM: PORTABLE CHEST - 1 VIEW  COMPARISON:  Chest radiograph performed 07/29/2013  FINDINGS: The lungs are well-aerated. Mild vascular congestion is noted. Mild bibasilar airspace opacities likely reflect atelectasis. There is no evidence of pleural effusion or pneumothorax.  The cardiomediastinal silhouette is borderline normal in size. No acute osseous abnormalities are seen.  IMPRESSION: Mild vascular congestion noted. Mild bibasilar airspace opacities likely reflect atelectasis. No displaced rib fracture seen.   Electronically Signed   By: Roanna RaiderJeffery  Chang M.D.   On: 11/06/2013 02:27      EKG Interpretation   Date/Time:  Tuesday November 06 2013 01:53:19 EDT Ventricular Rate:  72 PR Interval:  84 QRS Duration: 143 QT Interval:  432 QTC Calculation: 473 R Axis:   -64 Text Interpretation:  Sinus rhythm Short PR interval RBBB and LAFB  Abnormal lateral Q waves since last tracing no significant change  Confirmed by Mirelle Biskup  MD, Josue Kass ([id number]) on 11/06/2013 2:16:10 AM      MDM   Final diagnoses:  Altered mental status    Vital signs unremarkable, check for urinary infection, electrolyte disturbance or other source of altered mental status. At this time CT scan is not necessarily indicated, no signs of head injury bruising hematomas or otherwise.  The patient has DO NOT RESUSCITATE orders  D/w family results - they are OK taking him home -started on Levaquin yesterday as empiric abx by PMD - may have  cleared UA, no abnormal behaviour here, reassurance an return precautions given.  Vida Roller, MD 11/06/13 [phone]

## 2013-12-23 ENCOUNTER — Encounter (HOSPITAL_COMMUNITY): Payer: Self-pay | Admitting: Emergency Medicine

## 2013-12-23 ENCOUNTER — Emergency Department (HOSPITAL_COMMUNITY)
Admission: EM | Admit: 2013-12-23 | Discharge: 2013-12-23 | Disposition: A | Discharge status: Home / Self Care | Payer: Medicare Other | Attending: Emergency Medicine | Admitting: Emergency Medicine

## 2013-12-23 ENCOUNTER — Emergency Department (HOSPITAL_COMMUNITY): Payer: Medicare Other

## 2013-12-23 DIAGNOSIS — Z8639 Personal history of other endocrine, nutritional and metabolic disease: Secondary | ICD-10-CM | POA: Insufficient documentation

## 2013-12-23 DIAGNOSIS — Z880 Allergy status to penicillin: Secondary | ICD-10-CM | POA: Insufficient documentation

## 2013-12-23 DIAGNOSIS — Y939 Activity, unspecified: Secondary | ICD-10-CM | POA: Insufficient documentation

## 2013-12-23 DIAGNOSIS — J189 Pneumonia, unspecified organism: Secondary | ICD-10-CM | POA: Insufficient documentation

## 2013-12-23 DIAGNOSIS — Z8719 Personal history of other diseases of the digestive system: Secondary | ICD-10-CM | POA: Insufficient documentation

## 2013-12-23 DIAGNOSIS — Z862 Personal history of diseases of the blood and blood-forming organs and certain disorders involving the immune mechanism: Secondary | ICD-10-CM | POA: Insufficient documentation

## 2013-12-23 DIAGNOSIS — W19XXXA Unspecified fall, initial encounter: Secondary | ICD-10-CM | POA: Insufficient documentation

## 2013-12-23 DIAGNOSIS — F0390 Unspecified dementia without behavioral disturbance: Secondary | ICD-10-CM | POA: Insufficient documentation

## 2013-12-23 DIAGNOSIS — S199XXA Unspecified injury of neck, initial encounter: Secondary | ICD-10-CM

## 2013-12-23 DIAGNOSIS — S0990XA Unspecified injury of head, initial encounter: Secondary | ICD-10-CM | POA: Insufficient documentation

## 2013-12-23 DIAGNOSIS — Z8739 Personal history of other diseases of the musculoskeletal system and connective tissue: Secondary | ICD-10-CM | POA: Insufficient documentation

## 2013-12-23 DIAGNOSIS — S99929A Unspecified injury of unspecified foot, initial encounter: Secondary | ICD-10-CM

## 2013-12-23 DIAGNOSIS — Z79899 Other long term (current) drug therapy: Secondary | ICD-10-CM | POA: Insufficient documentation

## 2013-12-23 DIAGNOSIS — F4310 Post-traumatic stress disorder, unspecified: Secondary | ICD-10-CM | POA: Insufficient documentation

## 2013-12-23 DIAGNOSIS — S0993XA Unspecified injury of face, initial encounter: Secondary | ICD-10-CM | POA: Insufficient documentation

## 2013-12-23 DIAGNOSIS — F3289 Other specified depressive episodes: Secondary | ICD-10-CM | POA: Insufficient documentation

## 2013-12-23 DIAGNOSIS — F329 Major depressive disorder, single episode, unspecified: Secondary | ICD-10-CM | POA: Insufficient documentation

## 2013-12-23 DIAGNOSIS — S99919A Unspecified injury of unspecified ankle, initial encounter: Secondary | ICD-10-CM

## 2013-12-23 DIAGNOSIS — Z87448 Personal history of other diseases of urinary system: Secondary | ICD-10-CM | POA: Insufficient documentation

## 2013-12-23 DIAGNOSIS — Y9210 Unspecified residential institution as the place of occurrence of the external cause: Secondary | ICD-10-CM | POA: Insufficient documentation

## 2013-12-23 DIAGNOSIS — I10 Essential (primary) hypertension: Secondary | ICD-10-CM | POA: Insufficient documentation

## 2013-12-23 DIAGNOSIS — Z792 Long term (current) use of antibiotics: Secondary | ICD-10-CM | POA: Insufficient documentation

## 2013-12-23 DIAGNOSIS — S8990XA Unspecified injury of unspecified lower leg, initial encounter: Secondary | ICD-10-CM | POA: Insufficient documentation

## 2013-12-23 DIAGNOSIS — H9190 Unspecified hearing loss, unspecified ear: Secondary | ICD-10-CM | POA: Insufficient documentation

## 2013-12-23 LAB — URINALYSIS, ROUTINE W REFLEX MICROSCOPIC
Bilirubin Urine: NEGATIVE
Glucose, UA: NEGATIVE mg/dL
Hgb urine dipstick: NEGATIVE
Ketones, ur: NEGATIVE mg/dL
LEUKOCYTES UA: NEGATIVE
Nitrite: NEGATIVE
PH: 7 (ref 5.0–8.0)
Protein, ur: 30 mg/dL — AB
SPECIFIC GRAVITY, URINE: 1.019 (ref 1.005–1.030)
UROBILINOGEN UA: 1 mg/dL (ref 0.0–1.0)

## 2013-12-23 LAB — URINE MICROSCOPIC-ADD ON

## 2013-12-23 MED ORDER — ALIGN PO CAPS: 1.0000 | ORAL_CAPSULE | Freq: Every day | ORAL | Status: DC

## 2013-12-23 MED ORDER — LEVOFLOXACIN 750 MG PO TABS: 750.0000 mg | ORAL_TABLET | Freq: Every day | ORAL | Status: DC

## 2013-12-23 NOTE — Discharge Instructions (Signed)

## 2013-12-23 NOTE — ED Notes (Addendum)
Pt from [facility] via [facility] c/o  A fall. Pt slid on to Right knee causing an abrasion. Staff reports pt has seemed a bit more agitated than normal and suspects UTI. No blood thinners.

## 2013-12-23 NOTE — ED Provider Notes (Signed)
CSN: [id number]     Arrival date & time 12/23/13  1205 History   First MD Initiated Contact with Patient 12/23/13 1212     Chief Complaint  Patient presents with  . Fall  . Agitation   Level V caveat due to dementia  (Consider location/radiation/quality/duration/timing/severity/associated sxs/prior Treatment) Patient is a 80 y.o. male presenting with fall. The history is provided by the patient and a relative.  Fall   patient presents after a likely fall. Reportedly found down and the nursing home with his head against the wall and his knees on the ground. He reportedly been complaining of pain in his head and neck and right knee. He has had some agitation with some helpers recently. He was started on new medications any more sleepy. Family members state that 8 days ago he had a urinalysis done and they do not know the results yet. No fevers. He reported a good breakfast. No chest or abdominal pain.  Past Medical History  Diagnosis Date  . Hearing loss   . Dementia 2013  . PTSD (post-traumatic stress disorder)   . Hyperlipidemia   . GERD (gastroesophageal reflux disease)   . Allergic rhinitis due to pollen   . Osteoarthrosis involving, or with mention of more than one site, but not specified as generalized, multiple sites   . HTN (hypertension), benign   . Depression   . BPH (benign prostatic hypertrophy)    Past Surgical History  Procedure Laterality Date  . Appendectomy    . Ear lobe    . Cataract extraction    . Nose surgery    . Tonsillectomy     Family History  Problem Relation Age of Onset  . Diabetes Father   . Hypertension Father   . Gout      NOT KNOWN   History  Substance Use Topics  . Smoking status: Never Smoker   . Smokeless tobacco: Never Used  . Alcohol Use: No    Review of Systems  Unable to perform ROS     Allergies  Metoprolol and Penicillins  Home Medications   Prior to Admission medications   Medication Sig Start Date End Date Taking?  Authorizing Provider  acetaminophen (TYLENOL) 500 MG tablet Take 500 mg by mouth 3 (three) times daily.    Yes Historical Provider, MD  furosemide (LASIX) 20 MG tablet Take 20 mg by mouth.   Yes Historical Provider, MD  lisinopril (PRINIVIL,ZESTRIL) 20 MG tablet Take 20 mg by mouth daily.   Yes Historical Provider, MD  liver oil-zinc oxide (DESITIN) 40 % ointment Apply 1 application topically as needed for irritation.   Yes Historical Provider, MD  loperamide (IMODIUM) 2 MG capsule Take 2 mg by mouth as needed for diarrhea or loose stools (diarrhea).   Yes Historical Provider, MD  LORazepam (ATIVAN) 0.5 MG tablet Take 0.5 mg by mouth See admin instructions. Take 1 tab once daily at 1pm   Yes Historical Provider, MD  LORazepam (ATIVAN) 0.5 MG tablet Take 0.5 mg by mouth every 4 (four) hours as needed for anxiety (anxiety).    Yes Historical Provider, MD  memantine (NAMENDA) 10 MG tablet Take 10 mg by mouth 2 (two) times daily.   Yes Historical Provider, MD  mirtazapine (REMERON) 30 MG tablet Take 30 mg by mouth at bedtime.   Yes Historical Provider, MD  multivitamin-lutein (OCUVITE-LUTEIN) CAPS Take 1 capsule by mouth 2 (two) times daily.   Yes Historical Provider, MD  risperiDONE (RISPERDAL) 1 MG/ML oral  solution Take 0.5 mg by mouth daily. Due 1600   Yes Historical Provider, MD  bifidobacterium infantis (ALIGN) capsule Take 1 capsule by mouth daily. 12/23/13   Juliet Rude. Vence Lalor, MD  levofloxacin (LEVAQUIN) 750 MG tablet Take 1 tablet (750 mg total) by mouth daily. 12/23/13   Juliet Rude. Moncia Annas, MD  polyethylene glycol (MIRALAX / GLYCOLAX) packet Take 17 g by mouth daily as needed for mild constipation.    Historical Provider, MD   BP 167/55  Pulse 74  Temp(Src) 97.6 F (36.4 C) (Oral)  Resp 18  SpO2 99% Physical Exam  Constitutional: He appears well-developed and well-nourished.  HENT:  Head: Normocephalic.  Mild redness to forehead  Eyes: EOM are normal. Pupils are equal, round, and  reactive to light.  Neck: Normal range of motion. Neck supple.  Mild upper cervical tenderness without step-off or deformity  Cardiovascular: Normal rate and regular rhythm.   Pulmonary/Chest: Effort normal.  Slightly harsh breath sounds.  Abdominal: Soft. There is no tenderness.  Musculoskeletal: He exhibits tenderness.  Mild tenderness over right anterior knee. Slight swelling. Slight abrasion to bilateral anterior knees. No tenderness to upper extremities or hips.  Neurological: He is alert.  Skin: Skin is warm and dry.   patient has baseline moderate dementia  ED Course  Procedures (including critical care time) Labs Review Labs Reviewed  URINALYSIS, ROUTINE W REFLEX MICROSCOPIC - Abnormal; Notable for the following:    Protein, ur 30 (*)    All other components within normal limits  URINE MICROSCOPIC-ADD ON    Imaging Review Dg Chest 2 View  12/23/2013   CLINICAL DATA:  Found on floor.  Dementia.  EXAM: CHEST  2 VIEW  COMPARISON:  11/06/2013  FINDINGS: Heart is borderline in size. Right basilar atelectasis or infiltrate, new since prior study. Left lung is clear. No effusions. No acute bony abnormality.  IMPRESSION: Right basilar atelectasis or infiltrate   Electronically Signed   By: Charlett Nose M.D.   On: 12/23/2013 13:18   Ct Head Wo Contrast  12/23/2013   CLINICAL DATA:  Fall.  EXAM: CT HEAD WITHOUT CONTRAST  CT CERVICAL SPINE WITHOUT CONTRAST  TECHNIQUE: Multidetector CT imaging of the head and cervical spine was performed following the standard protocol without intravenous contrast. Multiplanar CT image reconstructions of the cervical spine were also generated.  COMPARISON:  10/23/2013  FINDINGS: CT HEAD FINDINGS  Mild age related volume loss. No acute intracranial abnormality. Specifically, no hemorrhage, hydrocephalus, mass lesion, acute infarction, or significant intracranial injury. No acute calvarial abnormality. Visualized paranasal sinuses and mastoids clear. Orbital  soft tissues unremarkable.  CT CERVICAL SPINE FINDINGS  Degenerative spurring throughout the cervical spine. Degenerative facet disease bilaterally, left greater than right. Slight anterolisthesis of C3 on C4 and C4 on C5, likely related to facet disease. Prevertebral soft tissues are normal. No fracture. No epidural or paraspinal hematoma.  2 cm low-density nodule in the left thyroid lobe, likely related to nodular goiter.  IMPRESSION: No acute intracranial abnormality.  Degenerative disc and facet disease in the cervical spine. No acute bony abnormality.   Electronically Signed   By: Charlett Nose M.D.   On: 12/23/2013 13:04   Ct Cervical Spine Wo Contrast  12/23/2013   CLINICAL DATA:  Fall.  EXAM: CT HEAD WITHOUT CONTRAST  CT CERVICAL SPINE WITHOUT CONTRAST  TECHNIQUE: Multidetector CT imaging of the head and cervical spine was performed following the standard protocol without intravenous contrast. Multiplanar CT image reconstructions of the cervical spine  were also generated.  COMPARISON:  10/23/2013  FINDINGS: CT HEAD FINDINGS  Mild age related volume loss. No acute intracranial abnormality. Specifically, no hemorrhage, hydrocephalus, mass lesion, acute infarction, or significant intracranial injury. No acute calvarial abnormality. Visualized paranasal sinuses and mastoids clear. Orbital soft tissues unremarkable.  CT CERVICAL SPINE FINDINGS  Degenerative spurring throughout the cervical spine. Degenerative facet disease bilaterally, left greater than right. Slight anterolisthesis of C3 on C4 and C4 on C5, likely related to facet disease. Prevertebral soft tissues are normal. No fracture. No epidural or paraspinal hematoma.  2 cm low-density nodule in the left thyroid lobe, likely related to nodular goiter.  IMPRESSION: No acute intracranial abnormality.  Degenerative disc and facet disease in the cervical spine. No acute bony abnormality.   Electronically Signed   By: Charlett NoseKevin  Dover M.D.   On: 12/23/2013 13:04    Dg Knee Complete 4 Views Right  12/23/2013   CLINICAL DATA:  Knee pain.  EXAM: RIGHT KNEE - COMPLETE 4+ VIEW  COMPARISON:  None.  FINDINGS: Diffuse osteopenia. Chondrocalcinosis. This is most likely degenerative. Other etiologies of chondrocalcinosis including crystal deposition disease cannot be excluded. Tricompartment degenerative change. Corticated bony densities noted along the anterior tibial tuberosity. This suggest old fractures versus prominent secondary ossification centers. Vascular calcifications consistent with peripheral vascular disease.  IMPRESSION: 1.  DJD.  Osteopenia.  No evidence of acute fracture.  2.  Chondrocalcinosis, most likely degenerative.  3. Peripheral vascular disease.   Electronically Signed   By: Maisie Fushomas  Register   On: 12/23/2013 13:20     EKG Interpretation None      MDM   Final diagnoses:  HCAP (healthcare-associated pneumonia)  Fall    Patient with fall. Possible pneumonia on xray. Imaging otherwise reassuring. Will treat with abx.     Juliet RudeNathan R. Rubin PayorPickering, MD 12/23/13 1515

## 2013-12-23 NOTE — ED Notes (Signed)
Pt. Had in and out performed. Pt. Tolerated it well. Urine was medium light brown in color. Nurse was notified.

## 2013-12-23 NOTE — ED Notes (Signed)
Bed: [id number] Expected date:  Expected time:  Means of arrival:  Comments: EMS- fall, possible UTI

## 2013-12-23 NOTE — ED Notes (Signed)
MD at bedside. 

## 2013-12-23 NOTE — ED Notes (Signed)
Pt in XRAY 

## 2014-02-01 ENCOUNTER — Emergency Department (HOSPITAL_COMMUNITY): Payer: Medicare Other

## 2014-02-01 ENCOUNTER — Emergency Department (HOSPITAL_COMMUNITY)
Admission: EM | Admit: 2014-02-01 | Discharge: 2014-02-01 | Disposition: A | Discharge status: Home / Self Care | Payer: Medicare Other | Attending: Emergency Medicine | Admitting: Emergency Medicine

## 2014-02-01 ENCOUNTER — Encounter (HOSPITAL_COMMUNITY): Payer: Self-pay | Admitting: Emergency Medicine

## 2014-02-01 DIAGNOSIS — W19XXXA Unspecified fall, initial encounter: Secondary | ICD-10-CM

## 2014-02-01 DIAGNOSIS — R5383 Other fatigue: Secondary | ICD-10-CM

## 2014-02-01 DIAGNOSIS — F0390 Unspecified dementia without behavioral disturbance: Secondary | ICD-10-CM | POA: Insufficient documentation

## 2014-02-01 DIAGNOSIS — Y9389 Activity, other specified: Secondary | ICD-10-CM | POA: Diagnosis not present

## 2014-02-01 DIAGNOSIS — R5381 Other malaise: Secondary | ICD-10-CM | POA: Insufficient documentation

## 2014-02-01 DIAGNOSIS — S0990XA Unspecified injury of head, initial encounter: Secondary | ICD-10-CM | POA: Diagnosis not present

## 2014-02-01 DIAGNOSIS — I10 Essential (primary) hypertension: Secondary | ICD-10-CM | POA: Diagnosis not present

## 2014-02-01 DIAGNOSIS — N390 Urinary tract infection, site not specified: Secondary | ICD-10-CM | POA: Insufficient documentation

## 2014-02-01 DIAGNOSIS — J189 Pneumonia, unspecified organism: Secondary | ICD-10-CM

## 2014-02-01 DIAGNOSIS — Y9210 Unspecified residential institution as the place of occurrence of the external cause: Secondary | ICD-10-CM | POA: Diagnosis not present

## 2014-02-01 DIAGNOSIS — R296 Repeated falls: Secondary | ICD-10-CM | POA: Insufficient documentation

## 2014-02-01 DIAGNOSIS — Z862 Personal history of diseases of the blood and blood-forming organs and certain disorders involving the immune mechanism: Secondary | ICD-10-CM | POA: Insufficient documentation

## 2014-02-01 DIAGNOSIS — Z880 Allergy status to penicillin: Secondary | ICD-10-CM | POA: Insufficient documentation

## 2014-02-01 DIAGNOSIS — Y92129 Unspecified place in nursing home as the place of occurrence of the external cause: Secondary | ICD-10-CM

## 2014-02-01 DIAGNOSIS — Z8719 Personal history of other diseases of the digestive system: Secondary | ICD-10-CM | POA: Diagnosis not present

## 2014-02-01 DIAGNOSIS — F3289 Other specified depressive episodes: Secondary | ICD-10-CM | POA: Insufficient documentation

## 2014-02-01 DIAGNOSIS — M159 Polyosteoarthritis, unspecified: Secondary | ICD-10-CM | POA: Insufficient documentation

## 2014-02-01 DIAGNOSIS — F4310 Post-traumatic stress disorder, unspecified: Secondary | ICD-10-CM | POA: Diagnosis not present

## 2014-02-01 DIAGNOSIS — Z79899 Other long term (current) drug therapy: Secondary | ICD-10-CM | POA: Insufficient documentation

## 2014-02-01 DIAGNOSIS — J159 Unspecified bacterial pneumonia: Secondary | ICD-10-CM | POA: Diagnosis not present

## 2014-02-01 DIAGNOSIS — Z87448 Personal history of other diseases of urinary system: Secondary | ICD-10-CM | POA: Insufficient documentation

## 2014-02-01 DIAGNOSIS — F329 Major depressive disorder, single episode, unspecified: Secondary | ICD-10-CM | POA: Insufficient documentation

## 2014-02-01 DIAGNOSIS — Z8639 Personal history of other endocrine, nutritional and metabolic disease: Secondary | ICD-10-CM | POA: Insufficient documentation

## 2014-02-01 DIAGNOSIS — IMO0002 Reserved for concepts with insufficient information to code with codable children: Secondary | ICD-10-CM | POA: Diagnosis not present

## 2014-02-01 LAB — CBC WITH DIFFERENTIAL/PLATELET
BASOS PCT: 0 % (ref 0–1)
Basophils Absolute: 0 10*3/uL (ref 0.0–0.1)
Eosinophils Absolute: 0.2 10*3/uL (ref 0.0–0.7)
Eosinophils Relative: 2 % (ref 0–5)
HEMATOCRIT: 44.4 % (ref 39.0–52.0)
Hemoglobin: 14.1 g/dL (ref 13.0–17.0)
Lymphocytes Relative: 11 % — ABNORMAL LOW (ref 12–46)
Lymphs Abs: 1 10*3/uL (ref 0.7–4.0)
MCH: 27.8 pg (ref 26.0–34.0)
MCHC: 31.8 g/dL (ref 30.0–36.0)
MCV: 87.6 fL (ref 78.0–100.0)
MONOS PCT: 7 % (ref 3–12)
Monocytes Absolute: 0.6 10*3/uL (ref 0.1–1.0)
NEUTROS ABS: 7.5 10*3/uL (ref 1.7–7.7)
Neutrophils Relative %: 80 % — ABNORMAL HIGH (ref 43–77)
Platelets: 165 10*3/uL (ref 150–400)
RBC: 5.07 MIL/uL (ref 4.22–5.81)
RDW: 16.5 % — ABNORMAL HIGH (ref 11.5–15.5)
WBC: 9.4 10*3/uL (ref 4.0–10.5)

## 2014-02-01 LAB — URINALYSIS, ROUTINE W REFLEX MICROSCOPIC
Bilirubin Urine: NEGATIVE
Glucose, UA: NEGATIVE mg/dL
Hgb urine dipstick: NEGATIVE
Ketones, ur: NEGATIVE mg/dL
LEUKOCYTES UA: NEGATIVE
NITRITE: NEGATIVE
PROTEIN: 30 mg/dL — AB
Specific Gravity, Urine: 1.021 (ref 1.005–1.030)
Urobilinogen, UA: 1 mg/dL (ref 0.0–1.0)
pH: 7 (ref 5.0–8.0)

## 2014-02-01 LAB — BASIC METABOLIC PANEL
Anion gap: 12 (ref 5–15)
BUN: 27 mg/dL — AB (ref 6–23)
CHLORIDE: 103 meq/L (ref 96–112)
CO2: 27 meq/L (ref 19–32)
CREATININE: 1.01 mg/dL (ref 0.50–1.35)
Calcium: 9.5 mg/dL (ref 8.4–10.5)
GFR calc Af Amer: 73 mL/min — ABNORMAL LOW (ref >90)
GFR calc non Af Amer: 63 mL/min — ABNORMAL LOW (ref >90)
GLUCOSE: 90 mg/dL (ref 70–99)
Potassium: 4.2 mEq/L (ref 3.7–5.3)
Sodium: 142 mEq/L (ref 137–147)

## 2014-02-01 LAB — TROPONIN I: Troponin I: <0.3 ng/mL (ref <0.30)

## 2014-02-01 LAB — URINE MICROSCOPIC-ADD ON: URINE-OTHER: NONE SEEN

## 2014-02-01 MED ORDER — MOXIFLOXACIN HCL 400 MG PO TABS: 400.0000 mg | ORAL_TABLET | Freq: Once | ORAL | Status: DC

## 2014-02-01 NOTE — ED Notes (Signed)
Bed: [id number] Expected date:  Expected time:  Means of arrival:  Comments: EMS 63M fall

## 2014-02-01 NOTE — Discharge Instructions (Signed)
Please follow up with your primary care physician in 1-2 days. If you do not have one please call the [facility] number listed above. Please take your antibiotic until completion. Please read all discharge instructions and return precautions.  ° °Pneumonia °Pneumonia is an infection of the lungs.  °CAUSES °Pneumonia may be caused by bacteria or a virus. Usually, these infections are caused by breathing infectious particles into the lungs (respiratory tract). °SIGNS AND SYMPTOMS  °· Cough. °· Fever. °· Chest pain. °· Increased rate of breathing. °· Wheezing. °· Mucus production. °DIAGNOSIS  °If you have the common symptoms of pneumonia, your health care provider will typically confirm the diagnosis with a chest X-ray. The X-ray will show an abnormality in the lung (pulmonary infiltrate) if you have pneumonia. Other tests of your blood, urine, or sputum may be done to find the specific cause of your pneumonia. Your health care provider may also do tests (blood gases or pulse oximetry) to see how well your lungs are working. °TREATMENT  °Some forms of pneumonia may be spread to other people when you cough or sneeze. You may be asked to wear a mask before and during your exam. Pneumonia that is caused by bacteria is treated with antibiotic medicine. Pneumonia that is caused by the influenza virus may be treated with an antiviral medicine. Most other viral infections must run their course. These infections will not respond to antibiotics.  °HOME CARE INSTRUCTIONS  °· Cough suppressants may be used if you are losing too much rest. However, coughing protects you by clearing your lungs. You should avoid using cough suppressants if you can. °· Your health care provider may have prescribed medicine if he or she thinks your pneumonia is caused by bacteria or influenza. Finish your medicine even if you start to feel better. °· Your health care provider may also prescribe an expectorant. This loosens the  mucus to be coughed up. °· Take medicines only as directed by your health care provider. °· Do not smoke. Smoking is a common cause of bronchitis and can contribute to pneumonia. If you are a smoker and continue to smoke, your cough may last several weeks after your pneumonia has cleared. °· A cold steam vaporizer or humidifier in your room or home may help loosen mucus. °· Coughing is often worse at night. Sleeping in a semi-upright position in a recliner or using a couple pillows under your head will help with this. °· Get rest as you feel it is needed. Your body will usually let you know when you need to rest. °PREVENTION °A pneumococcal shot (vaccine) is available to prevent a common bacterial cause of pneumonia. This is usually suggested for: °· People over 65 years old. °· Patients on chemotherapy. °· People with chronic lung problems, such as bronchitis or emphysema. °· People with immune system problems. °If you are over 65 or have a high risk condition, you may receive the pneumococcal vaccine if you have not received it before. In some countries, a routine influenza vaccine is also recommended. This vaccine can help prevent some cases of pneumonia. You may be offered the influenza vaccine as part of your care. °If you smoke, it is time to quit. You may receive instructions on how to stop smoking. Your health care provider can provide medicines and counseling to help you quit. °SEEK MEDICAL CARE IF: °You have a fever. °SEEK IMMEDIATE MEDICAL CARE IF:  °· Your illness becomes worse. This is especially true if you   are elderly or weakened from any other disease. °· You cannot control your cough with suppressants and are losing sleep. °· You begin coughing up blood. °· You develop pain which is getting worse or is uncontrolled with medicines. °· Any of the symptoms which initially brought you in for treatment are getting worse rather than better. °· You develop shortness of breath or chest pain. °MAKE SURE YOU:   °· Understand these instructions. °· Will watch your condition. °· Will get help right away if you are not doing well or get worse. °Document Released: 06/28/2005 Document Revised: 11/12/2013 Document Reviewed: 09/17/2010 °[employer]® Patient Information ©2015 [employer]. This information is not intended to replace advice given to you by your health care provider. Make sure you discuss any questions you have with your health care provider. ° °

## 2014-02-01 NOTE — ED Provider Notes (Signed)
CSN: [id number]     Arrival date & time 02/01/14  0548 History   First MD Initiated Contact with Patient 02/01/14 0606     Chief Complaint  Patient presents with  . Fall  . Urinary Tract Infection     (Consider location/radiation/quality/duration/timing/severity/associated sxs/prior Treatment) HPI Comments: Patient is a 80 year old male past medical history significant for dementia, hyperlipidemia, GERD, hypertension, BPH presenting to the emergency department from a nursing home for a fall with generalized weakness and fatigue and increased agitation. According to the nursing home staff patient was found on the ground, he was conscious and talking. Fall was unwitnessed. Patient is complaining of headache and low back pain, no other complaints. Patient's family states he has had increased weakness and agitation which is usually a sign of an infection for patient in the past. Patient is a level V caveat d/t dementia.   Patient is a 80 y.o. male presenting with fall and urinary tract infection. The history is provided by a relative and the patient.  Fall  Urinary Tract Infection    Past Medical History  Diagnosis Date  . Hearing loss   . Dementia 2013  . PTSD (post-traumatic stress disorder)   . Hyperlipidemia   . GERD (gastroesophageal reflux disease)   . Allergic rhinitis due to pollen   . Osteoarthrosis involving, or with mention of more than one site, but not specified as generalized, multiple sites   . HTN (hypertension), benign   . Depression   . BPH (benign prostatic hypertrophy)    Past Surgical History  Procedure Laterality Date  . Appendectomy    . Ear lobe    . Cataract extraction    . Nose surgery    . Tonsillectomy     Family History  Problem Relation Age of Onset  . Diabetes Father   . Hypertension Father   . Gout      NOT KNOWN   History  Substance Use Topics  . Smoking status: Never Smoker   . Smokeless tobacco: Never Used  . Alcohol Use: No     Review of Systems  Unable to perform ROS: Dementia      Allergies  Metoprolol and Penicillins  Home Medications   Prior to Admission medications   Medication Sig Start Date End Date Taking? Authorizing Provider  acetaminophen (TYLENOL) 500 MG tablet Take 500 mg by mouth 3 (three) times daily.    Yes Historical Provider, MD  furosemide (LASIX) 20 MG tablet Take 20 mg by mouth.   Yes Historical Provider, MD  lisinopril (PRINIVIL,ZESTRIL) 20 MG tablet Take 20 mg by mouth daily.   Yes Historical Provider, MD  liver oil-zinc oxide (DESITIN) 40 % ointment Apply 1 application topically as needed for irritation.   Yes Historical Provider, MD  loperamide (IMODIUM) 2 MG capsule Take 2 mg by mouth as needed for diarrhea or loose stools (diarrhea).   Yes Historical Provider, MD  LORazepam (ATIVAN) 0.5 MG tablet Take 0.5 mg by mouth See admin instructions. Take 1 tab once daily at 1pm   Yes Historical Provider, MD  Melatonin 5 MG TABS Take 1 tablet by mouth at bedtime as needed (sleep).   Yes Historical Provider, MD  memantine (NAMENDA) 10 MG tablet Take 10 mg by mouth 2 (two) times daily.   Yes Historical Provider, MD  mirtazapine (REMERON) 30 MG tablet Take 30 mg by mouth at bedtime.   Yes Historical Provider, MD  moxifloxacin (AVELOX) 400 MG tablet Take 1 tablet (400 mg  total) by mouth once. 02/01/14   Amalee Olsen L Amrit Erck, PA-C  multivitamin-lutein (OCUVITE-LUTEIN) CAPS Take 1 capsule by mouth 2 (two) times daily.   Yes Historical Provider, MD  polyethylene glycol (MIRALAX / GLYCOLAX) packet Take 17 g by mouth daily as needed for mild constipation.   Yes Historical Provider, MD  QUEtiapine (SEROQUEL) 25 MG tablet Take 25 mg by mouth daily.   Yes Historical Provider, MD   BP 201/81  Pulse 61  Temp(Src) 98 F (36.7 C) (Oral)  Resp 10  SpO2 98% Physical Exam  Nursing note and vitals reviewed. Constitutional: He appears well-developed and well-nourished. No distress.  HENT:  Head:  Normocephalic and atraumatic.  Right Ear: External ear normal.  Left Ear: External ear normal.  Nose: Nose normal.  Mouth/Throat: Oropharynx is clear and moist. No oropharyngeal exudate.  Eyes: Conjunctivae are normal.  Neck: Normal range of motion and full passive range of motion without pain. Neck supple.  Cardiovascular: Normal rate, regular rhythm, normal heart sounds and intact distal pulses.   Pulmonary/Chest: Effort normal and breath sounds normal. No respiratory distress.  Abdominal: Soft. Bowel sounds are normal. He exhibits no distension. There is no tenderness. There is no rebound and no guarding.  Musculoskeletal: He exhibits no edema.  MAE x 4  Lymphadenopathy:    He has no cervical adenopathy.  Neurological: He is alert.  Skin: Skin is warm and dry. He is not diaphoretic.    ED Course  Procedures (including critical care time) Medications - No data to display  Labs Review Labs Reviewed  URINALYSIS, ROUTINE W REFLEX MICROSCOPIC - Abnormal; Notable for the following:    Protein, ur 30 (*)    All other components within normal limits  CBC WITH DIFFERENTIAL - Abnormal; Notable for the following:    RDW 16.5 (*)    Neutrophils Relative % 80 (*)    Lymphocytes Relative 11 (*)    All other components within normal limits  BASIC METABOLIC PANEL - Abnormal; Notable for the following:    BUN 27 (*)    GFR calc non Af Amer 63 (*)    GFR calc Af Amer 73 (*)    All other components within normal limits  URINE CULTURE  TROPONIN I  URINE MICROSCOPIC-ADD ON    Imaging Review Dg Chest 2 View  02/01/2014   CLINICAL DATA:  80 year old male status post fall with fatigue, altered mental status. Initial encounter.  EXAM: CHEST  2 VIEW  COMPARISON:  12/23/2013 and earlier.  FINDINGS: Start semi 0641 hrs. Mildly lower lung volumes. Stable patchy opacity over the right hemidiaphragm. No pneumothorax or pulmonary edema. No pleural effusion or new pulmonary opacity. Stable cardiac size  and mediastinal contours.  IMPRESSION: Persistent patchy right lung base opacity which could reflect scarring, atelectasis, or continued/recurrent infection.   Electronically Signed   By: Augusto Gamble M.D.   On: 02/01/2014 07:06   Dg Lumbar Spine Complete  02/01/2014   CLINICAL DATA:  80 year old male status post fall. Initial encounter.  EXAM: LUMBAR SPINE - COMPLETE 4+ VIEW  COMPARISON:  07/29/2013.  FINDINGS: Normal lumbar segmentation. Stable bone mineralization. Stable lumbar vertebral height and alignment, with chronic L1 wedge compression fracture. Chronically advanced disc and endplate degeneration at L3-L4 with vacuum disc. Similar changes at L4-L5. No pars fracture. sacral ala and SI joints appear stable. Grossly intact visualized lower thoracic levels.  Extensive aortoiliac calcified atherosclerosis noted.  IMPRESSION: No acute fracture or listhesis identified in the lumbar spine.  Electronically Signed   By: Augusto Gamble M.D.   On: 02/01/2014 07:11   Ct Head Wo Contrast  02/01/2014   CLINICAL DATA:  Status post fall. Weakness. Concern for head or cervical spine injury.  EXAM: CT HEAD WITHOUT CONTRAST  CT CERVICAL SPINE WITHOUT CONTRAST  TECHNIQUE: Multidetector CT imaging of the head and cervical spine was performed following the standard protocol without intravenous contrast. Multiplanar CT image reconstructions of the cervical spine were also generated.  COMPARISON:  CT of the head and cervical spine performed 12/23/2013  FINDINGS: CT HEAD FINDINGS  There is no evidence of acute infarction, mass lesion, or intra- or extra-axial hemorrhage on CT.  Prominence of the ventricles and sulci reflects moderate cortical volume loss. Mild cerebellar atrophy is noted. Scattered periventricular and subcortical white matter change likely reflects small vessel ischemic microangiopathy.  The brainstem and fourth ventricle are within normal limits. The basal ganglia are unremarkable in appearance. The cerebral  hemispheres demonstrate grossly normal gray-white differentiation. No mass effect or midline shift is seen.  There is no evidence of fracture; visualized osseous structures are unremarkable in appearance. The orbits are within normal limits. The paranasal sinuses and mastoid air cells are well-aerated. No significant soft tissue abnormalities are seen.  CT CERVICAL SPINE FINDINGS  There is no evidence of acute fracture or subluxation. There is minimal grade 1 anterolisthesis of C3 on C4 and of C4 on C5. Vertebral bodies demonstrate normal height. Intervertebral disc spaces are preserved. Scattered anterior disc osteophyte complexes are seen along the cervical spine. Facet disease is noted along the cervical spine. Prevertebral soft tissues are within normal limits.  A 2.0 cm hypodensity is noted at the left thyroid lobe. The visualized lung apices are clear. Scattered calcification is seen at the carotid bifurcations bilaterally, with likely at least moderate bilateral luminal narrowing.  IMPRESSION: 1. No evidence of traumatic intracranial injury or fracture. 2. No evidence of acute fracture or subluxation along the cervical spine. 3. Mild degenerative change noted along the cervical spine. 4. Moderate cortical volume loss and scattered small vessel ischemic microangiopathy 5. 2.0 cm hypodensity at the left thyroid lobe. Consider further evaluation with thyroid ultrasound. If patient is clinically hyperthyroid, consider nuclear medicine thyroid uptake and scan. 6. Scattered calcification at the carotid bifurcations bilaterally, with likely at least moderate bilateral luminal narrowing. Carotid ultrasound would be helpful for further evaluation, when and as deemed clinically appropriate.   Electronically Signed   By: Roanna Raider M.D.   On: 02/01/2014 06:53   Ct Cervical Spine Wo Contrast  02/01/2014   CLINICAL DATA:  Status post fall. Weakness. Concern for head or cervical spine injury.  EXAM: CT HEAD WITHOUT  CONTRAST  CT CERVICAL SPINE WITHOUT CONTRAST  TECHNIQUE: Multidetector CT imaging of the head and cervical spine was performed following the standard protocol without intravenous contrast. Multiplanar CT image reconstructions of the cervical spine were also generated.  COMPARISON:  CT of the head and cervical spine performed 12/23/2013  FINDINGS: CT HEAD FINDINGS  There is no evidence of acute infarction, mass lesion, or intra- or extra-axial hemorrhage on CT.  Prominence of the ventricles and sulci reflects moderate cortical volume loss. Mild cerebellar atrophy is noted. Scattered periventricular and subcortical white matter change likely reflects small vessel ischemic microangiopathy.  The brainstem and fourth ventricle are within normal limits. The basal ganglia are unremarkable in appearance. The cerebral hemispheres demonstrate grossly normal gray-white differentiation. No mass effect or midline shift is seen.  There  is no evidence of fracture; visualized osseous structures are unremarkable in appearance. The orbits are within normal limits. The paranasal sinuses and mastoid air cells are well-aerated. No significant soft tissue abnormalities are seen.  CT CERVICAL SPINE FINDINGS  There is no evidence of acute fracture or subluxation. There is minimal grade 1 anterolisthesis of C3 on C4 and of C4 on C5. Vertebral bodies demonstrate normal height. Intervertebral disc spaces are preserved. Scattered anterior disc osteophyte complexes are seen along the cervical spine. Facet disease is noted along the cervical spine. Prevertebral soft tissues are within normal limits.  A 2.0 cm hypodensity is noted at the left thyroid lobe. The visualized lung apices are clear. Scattered calcification is seen at the carotid bifurcations bilaterally, with likely at least moderate bilateral luminal narrowing.  IMPRESSION: 1. No evidence of traumatic intracranial injury or fracture. 2. No evidence of acute fracture or subluxation  along the cervical spine. 3. Mild degenerative change noted along the cervical spine. 4. Moderate cortical volume loss and scattered small vessel ischemic microangiopathy 5. 2.0 cm hypodensity at the left thyroid lobe. Consider further evaluation with thyroid ultrasound. If patient is clinically hyperthyroid, consider nuclear medicine thyroid uptake and scan. 6. Scattered calcification at the carotid bifurcations bilaterally, with likely at least moderate bilateral luminal narrowing. Carotid ultrasound would be helpful for further evaluation, when and as deemed clinically appropriate.   Electronically Signed   By: Roanna RaiderJeffery  Chang M.D.   On: 02/01/2014 06:53     EKG Interpretation None      MDM   Final diagnoses:  HCAP (healthcare-associated pneumonia)  Fall at nursing home, initial encounter    Afebrile, NAD, non-toxic appearing, AAO x baseline. No neurofocal deficits on examination. I have reviewed nursing notes, vital signs, and all appropriate lab and imaging results for this patient. CT head and cervical spine negative. CXR concerning for possible PNA. Labs are reassuring. Discussed possible admission for HCAP, family is agreeable to discharge home on treatment. Return precautions discussed. Patient is agreeable to plan. Patient / Family / Caregiver informed of clinical course, understand medical decision-making and is agreeable to plan. Patient d/w with Dr. Ranae PalmsYelverton, agrees with plan.          Jeannetta EllisJennifer L Maddyx Vallie, PA-C 02/01/14 1506

## 2014-02-01 NOTE — ED Notes (Signed)
Patient was sent from alzheimer's unit to be evaluated for urinary tract infection, fall, and weakness.

## 2014-02-02 LAB — URINE CULTURE
Colony Count: NO GROWTH
Culture: NO GROWTH

## 2014-02-03 NOTE — ED Provider Notes (Signed)
Medical screening examination/treatment/procedure(s) were performed by non-physician practitioner and as supervising physician I was immediately available for consultation/collaboration.   EKG Interpretation None        Kimberely Mccannon, MD 02/03/14 0711 

## 2014-07-22 ENCOUNTER — Emergency Department (HOSPITAL_COMMUNITY): Payer: Medicare Other

## 2014-07-22 ENCOUNTER — Emergency Department (HOSPITAL_COMMUNITY)
Admission: EM | Admit: 2014-07-22 | Discharge: 2014-07-22 | Disposition: A | Discharge status: Home / Self Care | Payer: Medicare Other | Attending: Emergency Medicine | Admitting: Emergency Medicine

## 2014-07-22 ENCOUNTER — Encounter (HOSPITAL_COMMUNITY): Payer: Self-pay | Admitting: Emergency Medicine

## 2014-07-22 DIAGNOSIS — W01198A Fall on same level from slipping, tripping and stumbling with subsequent striking against other object, initial encounter: Secondary | ICD-10-CM | POA: Insufficient documentation

## 2014-07-22 DIAGNOSIS — Z87448 Personal history of other diseases of urinary system: Secondary | ICD-10-CM | POA: Diagnosis not present

## 2014-07-22 DIAGNOSIS — F4310 Post-traumatic stress disorder, unspecified: Secondary | ICD-10-CM | POA: Diagnosis not present

## 2014-07-22 DIAGNOSIS — S0990XA Unspecified injury of head, initial encounter: Secondary | ICD-10-CM | POA: Insufficient documentation

## 2014-07-22 DIAGNOSIS — Z8739 Personal history of other diseases of the musculoskeletal system and connective tissue: Secondary | ICD-10-CM | POA: Insufficient documentation

## 2014-07-22 DIAGNOSIS — Z792 Long term (current) use of antibiotics: Secondary | ICD-10-CM | POA: Insufficient documentation

## 2014-07-22 DIAGNOSIS — Y998 Other external cause status: Secondary | ICD-10-CM | POA: Diagnosis not present

## 2014-07-22 DIAGNOSIS — H9190 Unspecified hearing loss, unspecified ear: Secondary | ICD-10-CM | POA: Insufficient documentation

## 2014-07-22 DIAGNOSIS — W19XXXA Unspecified fall, initial encounter: Secondary | ICD-10-CM

## 2014-07-22 DIAGNOSIS — J449 Chronic obstructive pulmonary disease, unspecified: Secondary | ICD-10-CM | POA: Diagnosis not present

## 2014-07-22 DIAGNOSIS — I10 Essential (primary) hypertension: Secondary | ICD-10-CM | POA: Diagnosis not present

## 2014-07-22 DIAGNOSIS — F329 Major depressive disorder, single episode, unspecified: Secondary | ICD-10-CM | POA: Diagnosis not present

## 2014-07-22 DIAGNOSIS — F0390 Unspecified dementia without behavioral disturbance: Secondary | ICD-10-CM | POA: Insufficient documentation

## 2014-07-22 DIAGNOSIS — Y92128 Other place in nursing home as the place of occurrence of the external cause: Secondary | ICD-10-CM | POA: Insufficient documentation

## 2014-07-22 DIAGNOSIS — Z880 Allergy status to penicillin: Secondary | ICD-10-CM | POA: Diagnosis not present

## 2014-07-22 DIAGNOSIS — Z79899 Other long term (current) drug therapy: Secondary | ICD-10-CM | POA: Insufficient documentation

## 2014-07-22 DIAGNOSIS — Z8719 Personal history of other diseases of the digestive system: Secondary | ICD-10-CM | POA: Insufficient documentation

## 2014-07-22 DIAGNOSIS — G4489 Other headache syndrome: Secondary | ICD-10-CM

## 2014-07-22 DIAGNOSIS — Y9301 Activity, walking, marching and hiking: Secondary | ICD-10-CM | POA: Insufficient documentation

## 2014-07-22 LAB — COMPREHENSIVE METABOLIC PANEL
ALK PHOS: 74 U/L (ref 39–117)
ALT: 15 U/L (ref 0–53)
AST: 23 U/L (ref 0–37)
Albumin: 3.2 g/dL — ABNORMAL LOW (ref 3.5–5.2)
Anion gap: 8 (ref 5–15)
BUN: 27 mg/dL — ABNORMAL HIGH (ref 6–23)
CALCIUM: 8.4 mg/dL (ref 8.4–10.5)
CO2: 27 mmol/L (ref 19–32)
CREATININE: 1.25 mg/dL (ref 0.50–1.35)
Chloride: 105 mEq/L (ref 96–112)
GFR calc Af Amer: 57 mL/min — ABNORMAL LOW (ref >90)
GFR calc non Af Amer: 49 mL/min — ABNORMAL LOW (ref >90)
Glucose, Bld: 90 mg/dL (ref 70–99)
Potassium: 3.4 mmol/L — ABNORMAL LOW (ref 3.5–5.1)
Sodium: 140 mmol/L (ref 135–145)
Total Bilirubin: 0.6 mg/dL (ref 0.3–1.2)
Total Protein: 5.4 g/dL — ABNORMAL LOW (ref 6.0–8.3)

## 2014-07-22 LAB — I-STAT CHEM 8, ED
BUN: 25 mg/dL — ABNORMAL HIGH (ref 6–23)
CALCIUM ION: 1.2 mmol/L (ref 1.13–1.30)
CHLORIDE: 104 meq/L (ref 96–112)
Creatinine, Ser: 1.3 mg/dL (ref 0.50–1.35)
Glucose, Bld: 88 mg/dL (ref 70–99)
HCT: 37 % — ABNORMAL LOW (ref 39.0–52.0)
Hemoglobin: 12.6 g/dL — ABNORMAL LOW (ref 13.0–17.0)
Potassium: 3.3 mmol/L — ABNORMAL LOW (ref 3.5–5.1)
SODIUM: 142 mmol/L (ref 135–145)
TCO2: 23 mmol/L (ref 0–100)

## 2014-07-22 LAB — CBC
HEMATOCRIT: 37.9 % — AB (ref 39.0–52.0)
Hemoglobin: 12.3 g/dL — ABNORMAL LOW (ref 13.0–17.0)
MCH: 28.7 pg (ref 26.0–34.0)
MCHC: 32.5 g/dL (ref 30.0–36.0)
MCV: 88.3 fL (ref 78.0–100.0)
Platelets: 153 10*3/uL (ref 150–400)
RBC: 4.29 MIL/uL (ref 4.22–5.81)
RDW: 14.5 % (ref 11.5–15.5)
WBC: 7.6 10*3/uL (ref 4.0–10.5)

## 2014-07-22 NOTE — ED Provider Notes (Signed)
CSN: [id number]     Arrival date & time 07/22/14  0402 History   First MD Initiated Contact with Patient 07/22/14 0600     Chief Complaint  Patient presents with  . Tailbone Pain     (Consider location/radiation/quality/duration/timing/severity/associated sxs/prior Treatment) HPI Comments: The patient is a 81 year old male currently resident of [facility] past medical history of dementia, hypertension, COPD presenting emergency room chief complaint of fall and headache. Patient's family reports being notified the patient was found on the floor unwitnessed fall. Patient complained of headache. Patient's family reports patient has chronic back pain, known vertebral fracture. Patient however complained of headache to family earlier today. Patient's family denies recent illness, fever, cough, diarrhea, vomiting, fever. Patient's family reports patient at baseline mental status. Level V caveat due to dementia.  The history is provided by the patient and a relative. No language interpreter was used.    Past Medical History  Diagnosis Date  . Hearing loss   . Dementia 2013  . PTSD (post-traumatic stress disorder)   . Hyperlipidemia   . GERD (gastroesophageal reflux disease)   . Allergic rhinitis due to pollen   . Osteoarthrosis involving, or with mention of more than one site, but not specified as generalized, multiple sites   . HTN (hypertension), benign   . Depression   . BPH (benign prostatic hypertrophy)    Past Surgical History  Procedure Laterality Date  . Appendectomy    . Ear lobe    . Cataract extraction    . Nose surgery    . Tonsillectomy     Family History  Problem Relation Age of Onset  . Diabetes Father   . Hypertension Father   . Gout      NOT KNOWN   History  Substance Use Topics  . Smoking status: Never Smoker   . Smokeless tobacco: Never Used  . Alcohol Use: No    Review of Systems  Unable to perform ROS: Dementia      Allergies  Metoprolol and  Penicillins  Home Medications   Prior to Admission medications   Medication Sig Start Date End Date Taking? Authorizing Provider  acetaminophen (TYLENOL) 500 MG tablet Take 500 mg by mouth 3 (three) times daily.    Yes Historical Provider, MD  Benzoyl Peroxide 2.5 % LIQD Apply 1 application topically 2 (two) times daily. Use to wash face & neck   Yes Historical Provider, MD  clindamycin (CLEOCIN T) 1 % external solution Apply 1 application topically 2 (two) times daily.   Yes Historical Provider, MD  furosemide (LASIX) 20 MG tablet Take 20 mg by mouth.   Yes Historical Provider, MD  ketoconazole (NIZORAL) 2 % shampoo Apply 1 application topically 2 (two) times a week.   Yes Historical Provider, MD  lisinopril (PRINIVIL,ZESTRIL) 20 MG tablet Take 20 mg by mouth daily.   Yes Historical Provider, MD  LORazepam (ATIVAN) 0.5 MG tablet Take 0.5 mg by mouth every 4 (four) hours as needed for anxiety. Take 1 tab once daily at 1pm   Yes Historical Provider, MD  memantine (NAMENDA) 10 MG tablet Take 10 mg by mouth 2 (two) times daily.   Yes Historical Provider, MD  mirtazapine (REMERON) 30 MG tablet Take 30 mg by mouth at bedtime.   Yes Historical Provider, MD  multivitamin-lutein (OCUVITE-LUTEIN) CAPS Take 1 capsule by mouth 2 (two) times daily.   Yes Historical Provider, MD  QUEtiapine (SEROQUEL) 25 MG tablet Take 25 mg by mouth daily.  Yes Historical Provider, MD  sulfamethoxazole-trimethoprim (BACTRIM DS,SEPTRA DS) 800-160 MG per tablet Take 1 tablet by mouth 2 (two) times daily.   Yes Historical Provider, MD  liver oil-zinc oxide (DESITIN) 40 % ointment Apply 1 application topically as needed for irritation.    Historical Provider, MD  loperamide (IMODIUM) 2 MG capsule Take 2 mg by mouth as needed for diarrhea or loose stools (diarrhea).    Historical Provider, MD  Melatonin 5 MG TABS Take 1 tablet by mouth at bedtime as needed (sleep).    Historical Provider, MD  moxifloxacin (AVELOX) 400 MG tablet  Take 1 tablet (400 mg total) by mouth once. Patient not taking: Reported on 07/22/2014 02/01/14   Lise AuerJennifer L Piepenbrink, PA-C  polyethylene glycol (MIRALAX / GLYCOLAX) packet Take 17 g by mouth daily as needed for mild constipation.    Historical Provider, MD   BP 146/49 mmHg  Pulse 55  Temp(Src) 97.8 F (36.6 C) (Oral)  Resp 18  Ht 5\' 6"  (1.676 m)  Wt 200 lb (90.719 kg)  BMI 32.30 kg/m2  SpO2 99% Physical Exam  Constitutional: He appears well-developed and well-nourished. No distress.  Patient sleeping upon entering room.  HENT:  Head: Normocephalic and atraumatic. Head is without contusion.  No signs of trauma or tenderness to palpation.  Eyes: Pupils are equal, round, and reactive to light.  Neck: Neck supple.  No c-spine tenderness with palpation.  Cardiovascular: Normal rate and regular rhythm.   No lower extremity edema.  Pulmonary/Chest: Effort normal and breath sounds normal. He has no wheezes. He has no rales.  Abdominal: Soft. There is no tenderness. There is no rebound and no guarding.  Musculoskeletal: Normal range of motion. He exhibits no tenderness.  No tenderness palpation of bilateral hips, full passive range of motion without discomfort. No other obvious contusions.   Neurological: He is disoriented. No sensory deficit.  Oriented to person. Disoriented to place and time. Awakens to voice. Good grip strength bilaterally.  Moves lower extremities equally. No obvious facial droop.  Skin: Skin is warm and dry. He is not diaphoretic.  Psychiatric: His behavior is normal.  Nursing note and vitals reviewed.   ED Course  Procedures (including critical care time) Labs Review Labs Reviewed  CBC - Abnormal; Notable for the following:    Hemoglobin 12.3 (*)    HCT 37.9 (*)    All other components within normal limits  COMPREHENSIVE METABOLIC PANEL - Abnormal; Notable for the following:    Potassium 3.4 (*)    BUN 27 (*)    Total Protein 5.4 (*)    Albumin 3.2 (*)     GFR calc non Af Amer 49 (*)    GFR calc Af Amer 57 (*)    All other components within normal limits  I-STAT CHEM 8, ED - Abnormal; Notable for the following:    Potassium 3.3 (*)    BUN 25 (*)    Hemoglobin 12.6 (*)    HCT 37.0 (*)    All other components within normal limits    Imaging Review Ct Head Wo Contrast  07/22/2014   CLINICAL DATA:  Patient found down.  Fall.  EXAM: CT HEAD WITHOUT CONTRAST  TECHNIQUE: Contiguous axial images were obtained from the base of the skull through the vertex without intravenous contrast.  COMPARISON:  Head CT scan 02/01/2014.  FINDINGS: Atrophy and chronic microvascular ischemic change are again seen. There is no evidence of acute intracranial abnormality including infarct, hemorrhage, mass lesion, mass  effect, midline shift or abnormal extra-axial fluid collection. No hydrocephalus or pneumocephalus. The calvarium is intact.  IMPRESSION: No acute abnormality.  Atrophy and chronic microvascular ischemic change.   Electronically Signed   By: Drusilla Kanner M.D.   On: 07/22/2014 07:17     EKG Interpretation   Date/Time:  Monday July 22 2014 05:56:24 EST Ventricular Rate:  56 PR Interval:  225 QRS Duration: 154 QT Interval:  482 QTC Calculation: 465 R Axis:   -62 Text Interpretation:  Sinus rhythm Prolonged PR interval RBBB and LAFB  Baseline wander in lead(s) V1 No significant change was found Confirmed by  Read Drivers  MD, Jonny Ruiz ([id number]) on 07/22/2014 6:29:12 AM      MDM   Final diagnoses:  Fall  Headache syndrome  Pt with unwitnessed fall. No obvious signs of trauma. Pt with history of dementia, unchanged from baseline per family. CT negative, labs consistent with previous values.  EKG unchanged. Discussed with Dr. Read Drivers, he also evaluated the patient.  Plan to discharge to facility.       Mellody Drown, PA-C 07/22/14 [phone]

## 2014-07-22 NOTE — Discharge Instructions (Signed)
Call for a follow up appointment with a Family or Primary Care Provider.  °Return if Symptoms worsen.   °Take medication as prescribed.  ° °

## 2014-07-22 NOTE — ED Notes (Signed)
Bed: [id number] Expected date: 07/22/14 Expected time: 3:42 AM Means of arrival: Ambulance Comments: 81 yo M  Fall

## 2014-07-22 NOTE — ED Provider Notes (Signed)
Medical screening examination/treatment/procedure(s) were conducted as a shared visit with non-physician practitioner(s) and myself.  I personally evaluated the patient during the encounter.   EKG Interpretation   Date/Time:  Monday July 22 2014 05:56:24 EST Ventricular Rate:  56 PR Interval:  225 QRS Duration: 154 QT Interval:  482 QTC Calculation: 465 R Axis:   -62 Text Interpretation:  Sinus rhythm Prolonged PR interval RBBB and LAFB  Baseline wander in lead(s) V1 No significant change was found Confirmed by  Ricardo Fiorella  MD, Jonny RuizJOHN ([id number]) on 07/22/2014 6:29:12 AM      7:52 AM Sleeping comfortably. No coccygeal tenderness. Results reviewed.  Ricardo SeamenJohn L Edwards Route, MD 07/22/14 [phone]

## 2014-07-22 NOTE — ED Notes (Signed)
MD at bedside. 

## 2014-07-22 NOTE — ED Notes (Signed)
Awake. Verbally responsive. A/O x4. Resp even and unlabored. No audible adventitious breath sounds noted. ABC's intact.  

## 2014-07-22 NOTE — ED Notes (Addendum)
[facility] Reported found on floor in sitting position c/o coccyx pain, unk LOC, pt reported walking and stumble and fell and believes he hit his head on floor. No injuries or increase in pain with palpation.

## 2014-09-10 ENCOUNTER — Telehealth: Payer: Self-pay

## 2014-09-10 NOTE — Telephone Encounter (Signed)
An unidentified lady left v/m to stop calling this number (no phone number was left) calling on behalf of Ricardo Edwards;does not want any [facility]; [facility] and Dr Alphonsus SiasLetvak are no longer pts physician. Dr Alphonsus SiasLetvak is not listed as PCP.

## 2014-09-20 NOTE — Telephone Encounter (Signed)
Sent an Armed forces technical officer-mail to [facility] requesting that the [facility] be [facility] from [facility].

## 2014-09-26 IMAGING — CR DG LUMBAR SPINE COMPLETE 4+V
5 series · 5 of 5 positions shown · non-contrast
Comparison: 07/29/2013.

CLINICAL DATA: [AGE] male status post fall. Initial
encounter.

EXAM:
LUMBAR SPINE - COMPLETE 4+ VIEW

[t lumbar spine ap]
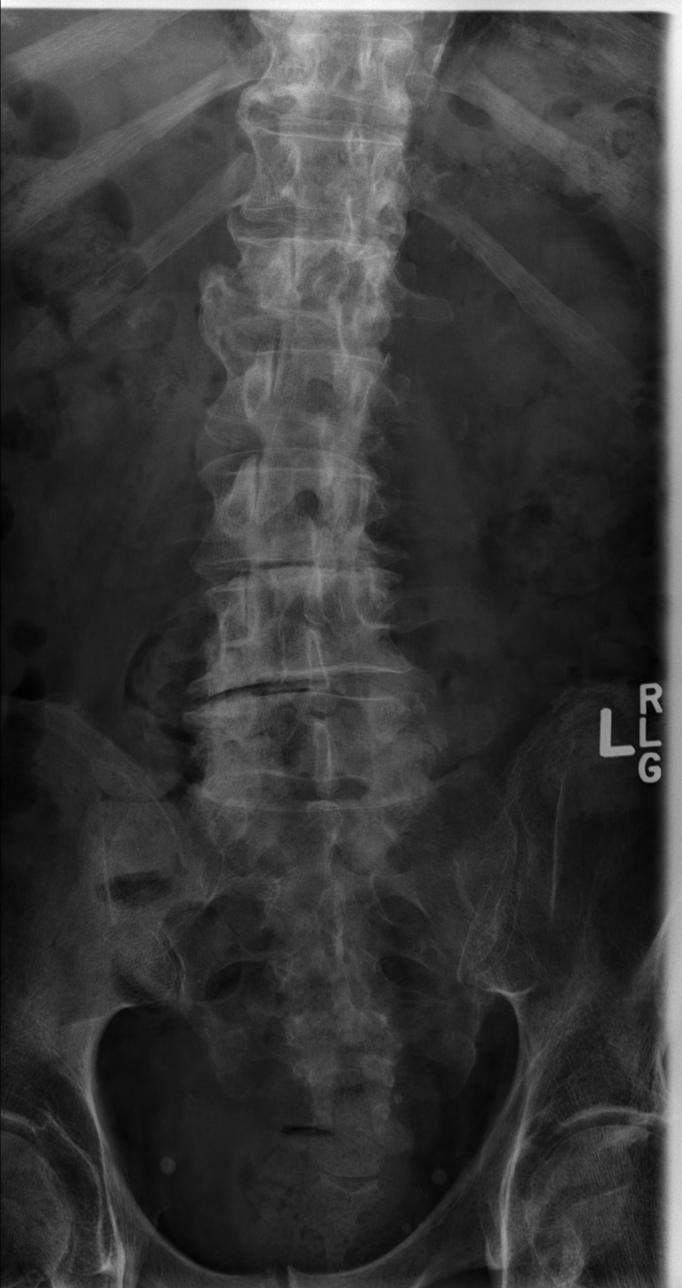

[t lumbar spine obl (1 of 2)]
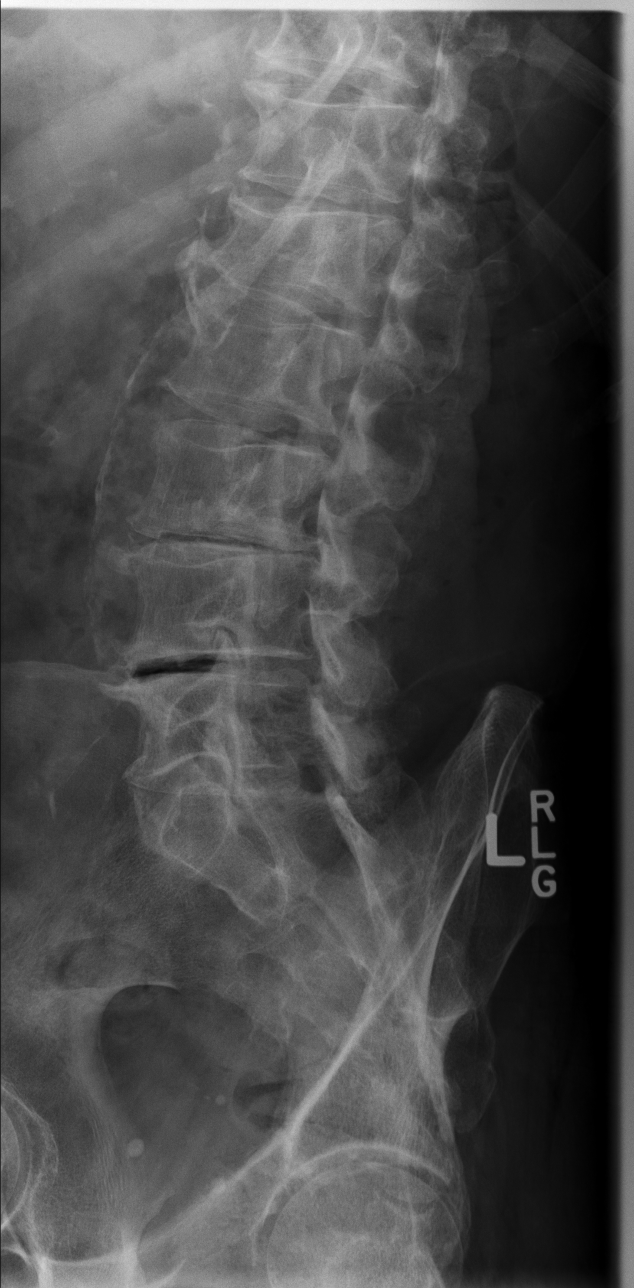

[t lumbar spine obl (2 of 2)]
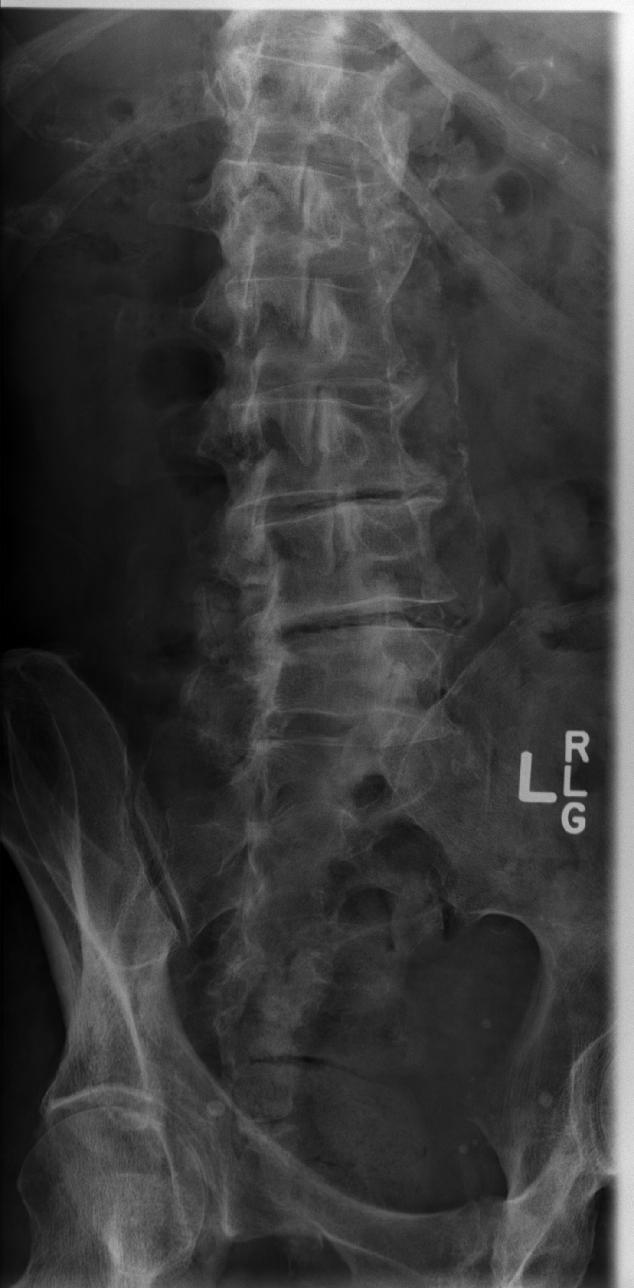

[t lumbar spine lat]
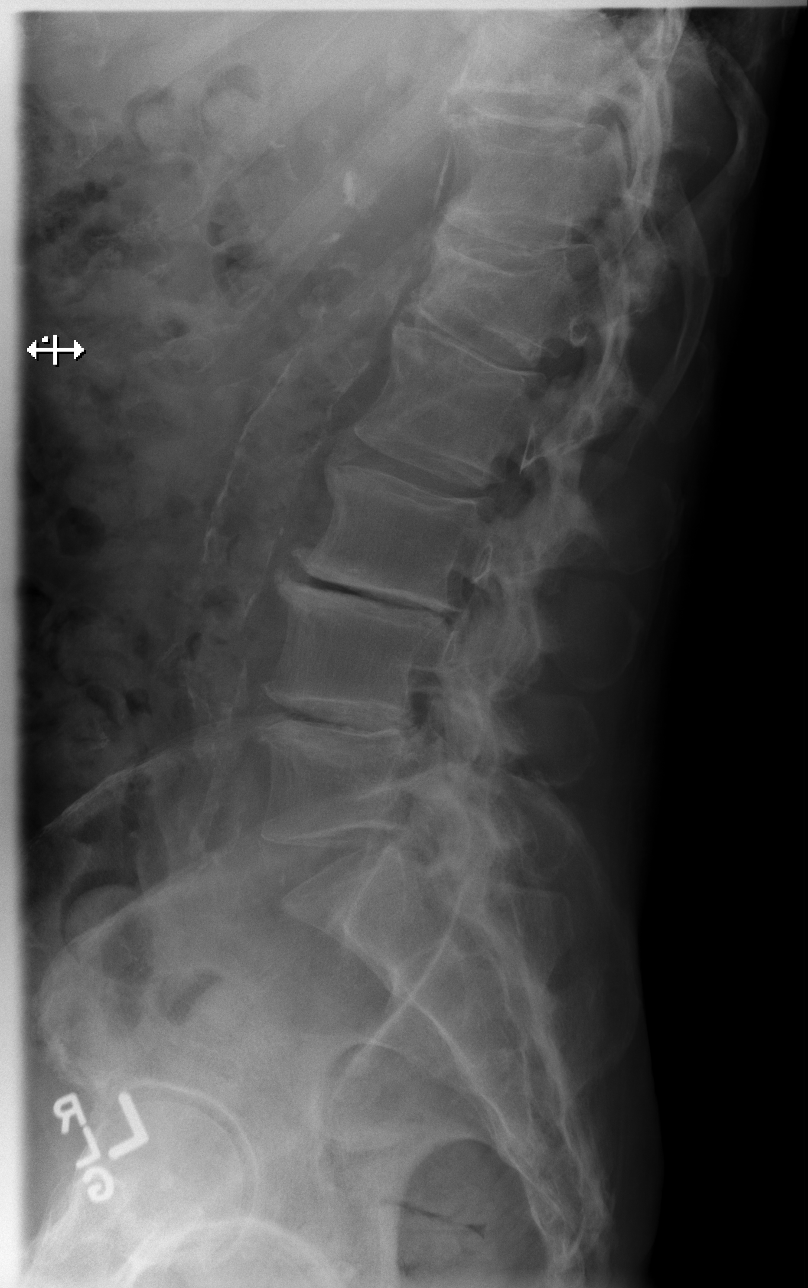

[t lumbar l-5 s-1 spot]
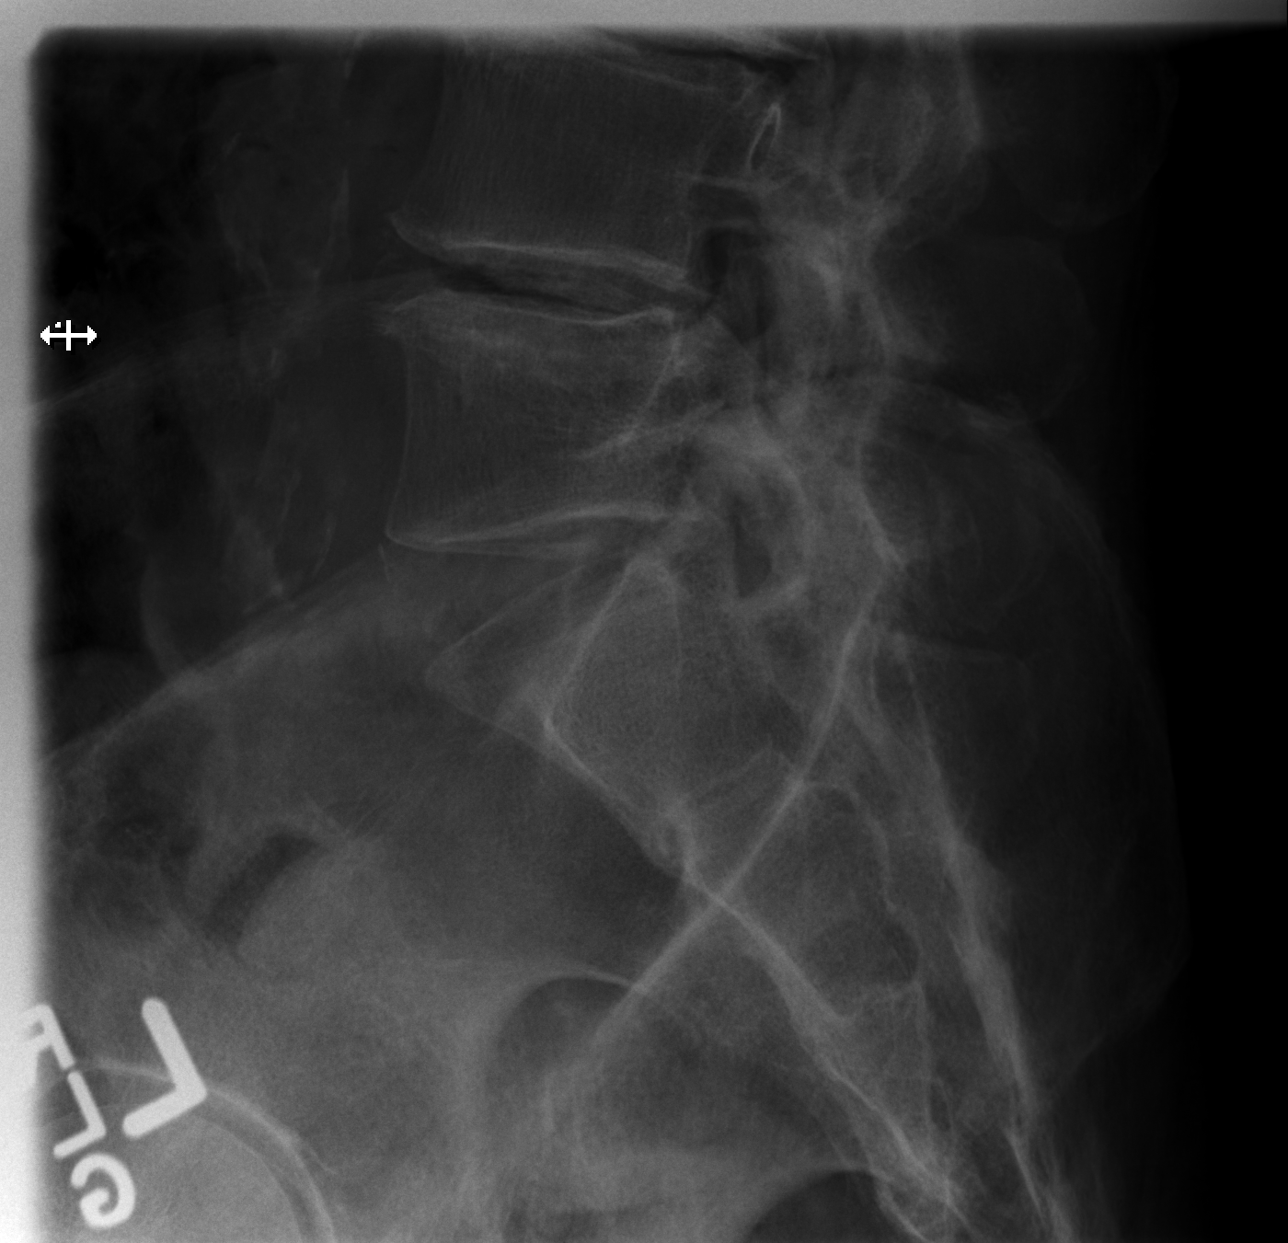

[5 of 5 positions shown; findings below may reference images not displayed]

FINDINGS: Normal lumbar segmentation. Stable bone mineralization. Stable
lumbar vertebral height and alignment, with chronic L1 wedge
compression fracture. Chronically advanced disc and endplate
degeneration at L3-L4 with vacuum disc. Similar changes at L4-L5. No
pars fracture. sacral ala and SI joints appear stable. Grossly
intact visualized lower thoracic levels.

Extensive aortoiliac calcified atherosclerosis noted.
IMPRESSION: No acute fracture or listhesis identified in the lumbar spine.

## 2014-11-01 NOTE — Discharge Summary (Signed)
PATIENT NAME:  Ricardo Edwards, Vineet MR#:  [id number] DATE OF BIRTH:  [birth year]  DATE OF ADMISSION:  03/14/2013 DATE OF DISCHARGE: 03/19/2013   DISCHARGE DIAGNOSES: 1.  Altered mental status secondary metabolic encephalopathy from pneumonia.  2.  Acute renal failure, resolved.  3.  Generalized weakness.  4.  Anxiety and depression.  5.  Dementia.  6.  Gastroesophageal reflux disease.  7.  Hypertension.  8.  Hyperlipidemia.  9.  Arthritis.   DISCHARGE MEDICATIONS: 1.  Aspirin 81 mg daily.  2.  Pepcid 20 mg p.o. b.i.d.  3.  KCl 20 mEq 2 tablets p.o. b.i.d.  4.  Flonase 50 mcg 2 sprays daily in each nostril.  5.  Norvasc 10 mg p.o. daily.  6.  Namenda XR 28 mg p.o. daily.  7.  HCTZ/lisinopril to 25/20 mg p.o. daily.  8.  Paxil 10 mg p.o. daily.  9.  Voltaren gel to the back b.i.d.  10.  Levaquin 750 mg every 48 hours for 3 doses.   DIET: Low-sodium, low-fat diet. The patient needs to be seen by a speech therapist regarding the diet changes.   HOSPITAL COURSE:  1.  The patient is an 81 year old male patient brought in [facility] because of altered mental status. The patient noted to have more confusion in [facility]. The patient was given an increased dose of Paxil and also prostate medications were started for urine difficulties, and the patient was noted to have increased confusion and decreased p.o. intake, and the patient brought in by his niece.  In the ER, the patient's chest x-ray showed pneumonia and CT head showed some sinusitis. The patient was started on Levaquin and also IV fluids for dehydration. Regarding altered mental status, CT head did not show any acute changes. Mental status improved the following day, and the patient was taking Namenda, but those medications were stopped because of altered mental status. Restart those medications. Right now, the patient is awake and alert, still has some memory gaps due to of his baseline dementia.  2.  Generalized weakness, seen by  physical therapy. They recommended SN[facility].  3.  Hypokalemia, getting replacement.  4.  Pneumonia. The patient received Levaquin. Repeat chest x-ray did not show any pneumonia, but chest x-ray done yesterday showed no CHF and no active disease. The patient's white count initially was high at 7.3 on 3rd September, normalized to 8.9 on September 4th. 5.  Low TSH at 0.214.  The patient needs repeat followup of TSH levels. His T4 and T3 are within normal limits.  His free T4 5.3 and T3 is 1.43.  6.  Dementia, restart Namenda.  7.  Constipation. Can use stool softeners as needed.  8.  Hypertension. Blood pressure is stable with his medications, lisinopril and HCTZ.  9.  Possible conjunctivitis on admission, started on cipro eye drops, but right now it is clear.  no need to continue cipro drop as his conjuctivitis is resolved 10. Hypokalemia. Replaced with IV and was replaced with p.o. potassium supplements.   Time spent on discharge of the patient more than 30 minutes. Discussed the plan with the patient and  niece, who is the power of attorney.  ____________________________ Katha HammingSnehalatha Gershon Shorten, MD sk:dmm D: 03/19/2013 11:02:54 ET T: 03/19/2013 11:44:33 ET JOB#: [id number]  cc: Katha HammingSnehalatha Ithzel Fedorchak, MD, <Dictator> Katha HammingSNEHALATHA Diego Ulbricht MD ELECTRONICALLY SIGNED 04/01/2013 16:21

## 2014-11-01 NOTE — H&P (Signed)
PATIENT NAME:  Ricardo Edwards, Ricardo Edwards MR#:  [id number] DATE OF BIRTH:  January 30, 1922  PRIMARY CARE PHYSICIAN: Dr. Lawanda Cousins.  REFERRING EMERGENCY ROOM PHYSICIAN: Glennie Isle, MD.  CHIEF COMPLAINT: Altered mental status.   HISTORY OF PRESENT ILLNESS: This is an 81 year old male with past medical history of hypertension, hyperlipidemia, gastroesophageal reflux disease, arthritis, who lives in an assisted living facility at [facility] and for the last 3 to 4 days, he became a little more confused, so they moved him into the respite facility at [facility], and were observing and treating him with increased dose of Paxil and some of the prostate medications, thinking that he has some urinary difficulty. They did not start him on any antibiotics, but the patient was not eating well enough for the last 3 to 4 days since this thing started, and his niece, who is also power of attorney, she got concerned by him not eating well and she thought that if they can transfer to ER and get more evaluated for any infection, it would be better. So the patient was brought to the ER, and all the workup to rule out any infection was done, which is found positive for a sinusitis on CT of the head.   As per the niece, the patient also complained 3 days ago about having some throat pain and that is why he was not eating enough. She denies any complaint of fever or any cough, sputum, or any other changes, but overall due to this infection and mental status change, the patient is more weak now as per her. The patient is unable to give me any history as he looks delirious and not very well oriented. He is following very simple commands.   REVIEW OF SYSTEMS: Unable to get it as this patient is delirious and not able to give me any more history.   PAST MEDICAL HISTORY:  1.  Sinus bradycardia.  2.  Hypertension.  3.  Hyperlipidemia.  4.  Gastroesophageal reflux disease.  5.  Arthritis.   PAST SURGICAL HISTORY:  Appendectomy.    MEDICATIONS:  1.  Tylenol extra strength 2 tablets every eight hours as needed for pain.  2.  Robitussin 10 mL oral every 4 hours as needed.  3.  Potassium chloride 20 mEq two times a day.  4.  Pepcid 20 mg two times a day.  5.  Paxil 10 mg once a day.  6.  Norvasc 10 mg once a day.  7.  Namenda extended-release 28 mg once a day.  8.  Mucinex 600 mg oral every 12 hours.  9.  Hydrochlorothiazide and lisinopril 25 plus 20 mg oral tablet once a day.  10.  Flonase 50 mcg inhalation sprays 2 sprays once a day.  11.  Aspirin 81 mg once a day   SOCIAL HISTORY: Lives in an assisted living facility at [facility]. No smoking. No alcohol use.   FAMILY HISTORY: Two of his brothers died. Father died of heart attack in his 24s and sister, around 47, is living.   PHYSICAL EXAMINATION:  VITAL SIGNS: In ER, temperature 98.5, pulse 63, respirations 12, blood pressure 180/73, pulse oximetry 95% on room air.  GENERAL: Alert but not very well oriented but does not appear in any acute distress.  HEENT: Head and neck atraumatic. Conjunctivae pink. Oral mucosa moist.  NECK: Supple. No JVD.  RESPIRATORY: Bilateral clear and equal air entry.  CARDIOVASCULAR: S1, S2 present. Systolic murmur present, regular.  ABDOMEN: Soft, nontender. Bowel sounds present.  No organomegaly.  SKIN: No rashes. Legs: No edema. Joints: No swelling or tenderness.  NEUROLOGICAL: Power 4/5 in all 4 limbs. Follows commands but is not very well oriented to time, place and person.  PSYCHIATRIC: Currently he is delirious so is unable to assess about his psychiatric status.   IMPORTANT LABORATORY RESULTS: Glucose 125, BUN 30, creatinine 1.41, sodium 136, potassium 3.1, chloride 100, CO2 28, calcium 9.1, albumin 2.9. Troponin less than 0.02. WBC 11.3, hemoglobin 13.6, platelet count 216, MCV 86.   Urinalysis is grossly negative.   Chest x-ray:  Heavy right lower lobe airspace disease which may represent atelectasis versus pneumonia.    CT of the head: Diffuse cerebral atrophy, mild sinusitis. No acute abnormality otherwise noted.   ASSESSMENT AND PLAN: This is an 81 year old male with past medical history is significant for hyperlipidemia, hypertension, and some depression who lives in an assisted living facility, getting some confusion and not eating well for the last 3 to 4 days and brought to the Emergency Room for altered mental status. On work-up in ER, he was found having some sinusitis CT of the head and pneumonia on chest x-ray.  1.  Altered mental status due to metabolic encephalopathy secondary to infection, pneumonia, and sinusitis. We will admit him to medical floor and will give Levaquin.  2.  Will hold all his depression and psychiatric medications as that might be a cause of his altered mental status also.  3.  Weakness. Will call physical therapy evaluation, but this might be just due to infection and will wait till infection clears up.  4.  Hypokalemia. We will replace potassium orally.  5.  Hypertension. IN THE PATIENT'S ALLERGY PROFILE, IT SHOWS HE IS ALLERGIC TO THIAZIDE, but the patient was taking hydrochlorothiazide and lisinopril as per the records provided by assisted living facility and also confirmed by pharmacy technician before admission here, so we will continue him on same medication as he was taking at home.  6.  Hyperlipidemia. The patient was not taking any medication. I ordered lipid profile for tomorrow morning to be checked.  7.  Follow demented status. Will check TSH level.   CODE STATUS: FULL CODE.   TOTAL TIME SPENT ON THIS ADMISSION: 50 minutes.   ____________________________ Hope PigeonVaibhavkumar G. Elisabeth PigeonVachhani, MD vgv:np D: 03/14/2013 16:02:12 ET T: 03/14/2013 16:43:08 ET JOB#: [id number]  cc: Hope PigeonVaibhavkumar G. Elisabeth PigeonVachhani, MD, <Dictator> Dr. Faythe GheeLudwig  Pheng Prokop [facility] Center[facility]td[facility]ACHHANI MD ELECTRONICALLY SIGNED 03/17/2013 15:41

## 2015-05-01 ENCOUNTER — Ambulatory Visit (INDEPENDENT_AMBULATORY_CARE_PROVIDER_SITE_OTHER): Payer: Medicare Other | Admitting: Podiatry

## 2015-05-01 ENCOUNTER — Encounter: Payer: Self-pay | Admitting: Podiatry

## 2015-05-01 DIAGNOSIS — B351 Tinea unguium: Secondary | ICD-10-CM | POA: Diagnosis not present

## 2015-05-01 DIAGNOSIS — M79675 Pain in left toe(s): Secondary | ICD-10-CM

## 2015-05-01 DIAGNOSIS — M79674 Pain in right toe(s): Secondary | ICD-10-CM

## 2015-05-01 NOTE — Progress Notes (Addendum)
   Subjective:    Patient ID: Ricardo Edwards, male    DOB: [birth year], 81 y.o.   MRN: [id number]  HPIThis patient presents to the office with chief complaint of painful long thick nails.  He has pain walking and wearing his shoes.  The patient presents here today for trimming of B/L toenails.  Review of Systems  All other systems reviewed and are negative.      Objective:   Physical Exam GENERAL APPEARANCE: Alert, conversant. Appropriately groomed. No acute distress.  VASCULAR: Pedal pulses palpable at  [facility] and PT bilateral.  Capillary refill time is immediate to all digits,  Normal temperature gradient.  Digital hair growth is present bilateral  NEUROLOGIC: sensation is normal to 5.07 monofilament at 5/5 sites bilateral.  Light touch is intact bilateral, Muscle strength normal.  MUSCULOSKELETAL: acceptable muscle strength, tone and stability bilateral.  Intrinsic muscluature intact bilateral.  Rectus appearance of foot and digits noted bilateral.   DERMATOLOGIC: skin color, texture, and turgor are within normal limits.  No preulcerative lesions or ulcers  are seen, no interdigital maceration noted.  No open lesions present.  . No drainage noted.  NAILS  Thick disfigured discolored nails with subungual debris both feet.         Assessment & Plan:  Onychomycosis    Debridement and grinding of long painful nails.  RTC prn

## 2015-06-17 ENCOUNTER — Emergency Department (HOSPITAL_COMMUNITY): Payer: Medicare Other

## 2015-06-17 ENCOUNTER — Encounter (HOSPITAL_COMMUNITY): Payer: Self-pay | Admitting: *Deleted

## 2015-06-17 ENCOUNTER — Emergency Department (HOSPITAL_COMMUNITY)
Admission: EM | Admit: 2015-06-17 | Discharge: 2015-06-17 | Disposition: A | Discharge status: Home / Self Care | Payer: Medicare Other | Attending: Emergency Medicine | Admitting: Emergency Medicine

## 2015-06-17 DIAGNOSIS — H9190 Unspecified hearing loss, unspecified ear: Secondary | ICD-10-CM | POA: Diagnosis not present

## 2015-06-17 DIAGNOSIS — Y92129 Unspecified place in nursing home as the place of occurrence of the external cause: Secondary | ICD-10-CM | POA: Diagnosis not present

## 2015-06-17 DIAGNOSIS — F0390 Unspecified dementia without behavioral disturbance: Secondary | ICD-10-CM | POA: Insufficient documentation

## 2015-06-17 DIAGNOSIS — S0993XA Unspecified injury of face, initial encounter: Secondary | ICD-10-CM | POA: Diagnosis present

## 2015-06-17 DIAGNOSIS — W050XXA Fall from non-moving wheelchair, initial encounter: Secondary | ICD-10-CM | POA: Insufficient documentation

## 2015-06-17 DIAGNOSIS — F329 Major depressive disorder, single episode, unspecified: Secondary | ICD-10-CM | POA: Insufficient documentation

## 2015-06-17 DIAGNOSIS — M1990 Unspecified osteoarthritis, unspecified site: Secondary | ICD-10-CM | POA: Insufficient documentation

## 2015-06-17 DIAGNOSIS — Z8639 Personal history of other endocrine, nutritional and metabolic disease: Secondary | ICD-10-CM | POA: Insufficient documentation

## 2015-06-17 DIAGNOSIS — Z880 Allergy status to penicillin: Secondary | ICD-10-CM | POA: Diagnosis not present

## 2015-06-17 DIAGNOSIS — Y9389 Activity, other specified: Secondary | ICD-10-CM | POA: Diagnosis not present

## 2015-06-17 DIAGNOSIS — S0181XA Laceration without foreign body of other part of head, initial encounter: Secondary | ICD-10-CM

## 2015-06-17 DIAGNOSIS — S0121XA Laceration without foreign body of nose, initial encounter: Secondary | ICD-10-CM | POA: Insufficient documentation

## 2015-06-17 DIAGNOSIS — Z792 Long term (current) use of antibiotics: Secondary | ICD-10-CM | POA: Insufficient documentation

## 2015-06-17 DIAGNOSIS — Z87438 Personal history of other diseases of male genital organs: Secondary | ICD-10-CM | POA: Diagnosis not present

## 2015-06-17 DIAGNOSIS — Z8719 Personal history of other diseases of the digestive system: Secondary | ICD-10-CM | POA: Insufficient documentation

## 2015-06-17 DIAGNOSIS — F4310 Post-traumatic stress disorder, unspecified: Secondary | ICD-10-CM | POA: Insufficient documentation

## 2015-06-17 DIAGNOSIS — Y998 Other external cause status: Secondary | ICD-10-CM | POA: Diagnosis not present

## 2015-06-17 DIAGNOSIS — I10 Essential (primary) hypertension: Secondary | ICD-10-CM | POA: Insufficient documentation

## 2015-06-17 DIAGNOSIS — Z79899 Other long term (current) drug therapy: Secondary | ICD-10-CM | POA: Insufficient documentation

## 2015-06-17 LAB — BASIC METABOLIC PANEL
ANION GAP: 7 (ref 5–15)
BUN: 30 mg/dL — ABNORMAL HIGH (ref 6–20)
CO2: 29 mmol/L (ref 22–32)
CREATININE: 1.12 mg/dL (ref 0.61–1.24)
Calcium: 8.8 mg/dL — ABNORMAL LOW (ref 8.9–10.3)
Chloride: 107 mmol/L (ref 101–111)
GFR calc non Af Amer: 55 mL/min — ABNORMAL LOW (ref >60)
Glucose, Bld: 98 mg/dL (ref 65–99)
Potassium: 3.7 mmol/L (ref 3.5–5.1)
SODIUM: 143 mmol/L (ref 135–145)

## 2015-06-17 LAB — CBC WITH DIFFERENTIAL/PLATELET
BASOS ABS: 0 10*3/uL (ref 0.0–0.1)
BASOS PCT: 0 %
EOS ABS: 0.2 10*3/uL (ref 0.0–0.7)
Eosinophils Relative: 3 %
HEMATOCRIT: 38.7 % — AB (ref 39.0–52.0)
Hemoglobin: 12.4 g/dL — ABNORMAL LOW (ref 13.0–17.0)
Lymphocytes Relative: 10 %
Lymphs Abs: 0.8 10*3/uL (ref 0.7–4.0)
MCH: 29.9 pg (ref 26.0–34.0)
MCHC: 32 g/dL (ref 30.0–36.0)
MCV: 93.3 fL (ref 78.0–100.0)
MONO ABS: 0.7 10*3/uL (ref 0.1–1.0)
MONOS PCT: 8 %
NEUTROS ABS: 6.1 10*3/uL (ref 1.7–7.7)
Neutrophils Relative %: 79 %
PLATELETS: 190 10*3/uL (ref 150–400)
RBC: 4.15 MIL/uL — ABNORMAL LOW (ref 4.22–5.81)
RDW: 14.1 % (ref 11.5–15.5)
WBC: 7.8 10*3/uL (ref 4.0–10.5)

## 2015-06-17 NOTE — ED Notes (Signed)
Per EMS [facility], with a c/o unwitnessed fall. Per EMS pt apparently fell forward from wheelchair hitting his head on the baseboard of a nearby column. Pt has laceration to his forehead and bridge of nose, bleeding controled.

## 2015-06-17 NOTE — ED Provider Notes (Signed)
CSN: [id number]     Arrival date & time 06/17/15  1429 History   First MD Initiated Contact with Patient 06/17/15 1504     Chief Complaint  Patient presents with  . Fall     (Consider location/radiation/quality/duration/timing/severity/associated sxs/prior Treatment) Patient is a 81 y.o. male presenting with fall. The history is provided by the nursing home.  Fall This is a new problem. The current episode started 3 to 5 hours ago. The problem occurs constantly. The problem has not changed since onset.Nothing aggravates the symptoms. Nothing relieves the symptoms.   patient reportedly fell forward out of the wheelchair unwitnessed. Unknown loss of consciousness. Nursing facility with 2 small lacerations over his face including forehead and nasal bridge.  Past Medical History  Diagnosis Date  . Hearing loss   . Dementia 2013  . PTSD (post-traumatic stress disorder)   . Hyperlipidemia   . GERD (gastroesophageal reflux disease)   . Allergic rhinitis due to pollen   . Osteoarthrosis involving, or with mention of more than one site, but not specified as generalized, multiple sites   . HTN (hypertension), benign   . Depression   . BPH (benign prostatic hypertrophy)    Past Surgical History  Procedure Laterality Date  . Appendectomy    . Ear lobe    . Cataract extraction    . Nose surgery    . Tonsillectomy     Family History  Problem Relation Age of Onset  . Diabetes Father   . Hypertension Father   . Gout      NOT KNOWN   Social History  Substance Use Topics  . Smoking status: Never Smoker   . Smokeless tobacco: Never Used  . Alcohol Use: No    Review of Systems  Unable to perform ROS: Dementia    Patient is level V caveat due to dementia  Allergies  Metoprolol and Penicillins  Home Medications   Prior to Admission medications   Medication Sig Start Date End Date Taking? Authorizing Provider  acetaminophen (TYLENOL) 500 MG tablet Take 500 mg by mouth 3  (three) times daily.    Yes Historical Provider, MD  cholecalciferol (VITAMIN D) 1000 UNITS tablet Take 2,000 Units by mouth daily.   Yes Historical Provider, MD  clindamycin (CLEOCIN T) 1 % external solution Apply 1 application topically 2 (two) times daily.   Yes Historical Provider, MD  ENSURE PLUS (ENSURE PLUS) LIQD Take 237 mLs by mouth 2 (two) times daily between meals.   Yes Historical Provider, MD  furosemide (LASIX) 20 MG tablet Take 20 mg by mouth.   Yes Historical Provider, MD  guaiFENesin-codeine (ROBITUSSIN AC) 100-10 MG/5ML syrup Take 10 mLs by mouth every 4 (four) hours as needed for cough or congestion.   Yes Historical Provider, MD  ketoconazole (NIZORAL) 2 % shampoo Apply 1 application topically 2 (two) times a week.   Yes Historical Provider, MD  lisinopril (PRINIVIL,ZESTRIL) 20 MG tablet Take 20 mg by mouth daily.   Yes Historical Provider, MD  loperamide (IMODIUM) 2 MG capsule Take 2 mg by mouth as needed for diarrhea or loose stools (diarrhea).   Yes Historical Provider, MD  LORazepam (ATIVAN) 0.5 MG tablet Take 0.5 mg by mouth 2 (two) times daily. Take 1 tab once daily at 1pm & 5 pm. May also take q4hprn for anxiety   Yes Historical Provider, MD  Melatonin 5 MG TABS Take 5 mg by mouth at bedtime as needed (sleep).    Yes Historical Provider,  MD  mirtazapine (REMERON) 30 MG tablet Take 30 mg by mouth at bedtime.   Yes Historical Provider, MD  multivitamin-lutein (OCUVITE-LUTEIN) CAPS Take 1 capsule by mouth 2 (two) times daily.   Yes Historical Provider, MD  neomycin-bacitracin-polymyxin (NEOSPORIN) [phone] ointment Apply 1 application topically daily as needed (wound care).   Yes Historical Provider, MD  polyethylene glycol (MIRALAX / GLYCOLAX) packet Take 17 g by mouth daily as needed for mild constipation.   Yes Historical Provider, MD  sertraline (ZOLOFT) 25 MG tablet Take 25 mg by mouth daily.   Yes Historical Provider, MD  sulfamethoxazole-trimethoprim (BACTRIM  DS,SEPTRA DS) 800-160 MG per tablet Take 1 tablet by mouth 2 (two) times daily.   Yes Historical Provider, MD   BP 140/65 mmHg  Pulse 59  Temp(Src) 98.1 F (36.7 C) (Oral)  Resp 16  SpO2 98% Physical Exam  Constitutional: He appears well-developed and well-nourished. He appears listless. No distress.  HENT:  Head: Normocephalic. Head is with laceration.    Eyes: Conjunctivae are normal.  Neck: Neck supple. No spinous process tenderness present. No tracheal deviation present.  Cardiovascular: Normal rate and regular rhythm.   Pulmonary/Chest: Effort normal. No respiratory distress. He exhibits no tenderness, no crepitus and no deformity.  Abdominal: Soft. He exhibits no distension. There is no tenderness.  Musculoskeletal:       Cervical back: He exhibits no deformity.  No tenderness of her pelvis or with passive range of motion of bilateral legs  Neurological: He appears listless. He is disoriented.  Minimal speech  Skin: Skin is warm and dry.    ED Course  Procedures (including critical care time)  LACERATION REPAIR Performed by: Lyndal PulleyKnott, Yasir Kitner Authorized by: Lyndal PulleyKnott, Marriana Hibberd Consent: Verbal consent obtained. Risks and benefits: risks, benefits and alternatives were discussed Consent given by: patient Patient identity confirmed: provided demographic data Prepped and Draped in normal sterile fashion Wound explored  Laceration Location: forehead, nasal bridge  Laceration Length: 1.5 cm over forehead, 0.2 cm nasal bridge  No Foreign Bodies seen or palpated  Anesthesia: none  Irrigation method: syringe Amount of cleaning: standard  Skin closure: glue  Technique: simple  Patient tolerance: Patient tolerated the procedure well with no immediate complications.   Labs Review Labs Reviewed  CBC WITH DIFFERENTIAL/PLATELET - Abnormal; Notable for the following:    RBC 4.15 (*)    Hemoglobin 12.4 (*)    HCT 38.7 (*)    All other components within normal limits  BASIC  METABOLIC PANEL - Abnormal; Notable for the following:    BUN 30 (*)    Calcium 8.8 (*)    GFR calc non Af Amer 55 (*)    All other components within normal limits    Imaging Review Dg Chest 2 View  06/17/2015  CLINICAL DATA:  Recent fall EXAM: CHEST - 2 VIEW COMPARISON:  02/01/2014 FINDINGS: Cardiac shadow is within normal limits. Aortic calcifications are noted. Lungs are clear bilaterally. No acute fracture is noted. IMPRESSION: No acute abnormality noted. Electronically Signed   By: Alcide CleverMark  Lukens M.D.   On: 06/17/2015 16:14   Dg Pelvis 1-2 Views  06/17/2015  CLINICAL DATA:  Unwitnessed fall. Initial encounter. EXAM: PELVIS - 1-2 VIEW COMPARISON:  None. FINDINGS: There is no evidence of pelvic fracture or diastasis. Both hips appear located and intact. Advanced lower lumbar degenerative disc disease.  Osteopenia. IMPRESSION: No acute finding. Electronically Signed   By: Marnee SpringJonathon  Watts M.D.   On: 06/17/2015 16:12   Ct Head Wo  Contrast  06/17/2015  CLINICAL DATA:  Unwitnessed fall with forehead laceration. Initial encounter. EXAM: CT HEAD WITHOUT CONTRAST CT CERVICAL SPINE WITHOUT CONTRAST TECHNIQUE: Multidetector CT imaging of the head and cervical spine was performed following the standard protocol without intravenous contrast. Multiplanar CT image reconstructions of the cervical spine were also generated. COMPARISON:  Head CT 07/22/2014 FINDINGS: CT HEAD FINDINGS Skull and Sinuses:  Forehead contusion without fracture. Orbits: Bilateral cataract resection.  No traumatic finding. Brain: No evidence of acute infarction, hemorrhage, hydrocephalus, or mass lesion/mass effect. Generalized atrophy with ventriculomegaly. Expected/ mild chronic small vessel disease with ischemic gliosis around the lateral ventricles. CT CERVICAL SPINE FINDINGS Negative for acute fracture or subluxation. No prevertebral edema. No gross cervical canal hematoma. Diffuse facet arthropathy and spurring with mild  anterolisthesis at C3-4, C4-5, C5-6. Multilevel spondylosis with bulky ventral spurring at C6-7 Stable, compared to 07/29/2013, low-density nodule in the left thyroid measuring 18 mm . IMPRESSION: No evidence of acute intracranial or cervical spine injury. Electronically Signed   By: Marnee Spring M.D.   On: 06/17/2015 16:10   Ct Cervical Spine Wo Contrast  06/17/2015  CLINICAL DATA:  Unwitnessed fall with forehead laceration. Initial encounter. EXAM: CT HEAD WITHOUT CONTRAST CT CERVICAL SPINE WITHOUT CONTRAST TECHNIQUE: Multidetector CT imaging of the head and cervical spine was performed following the standard protocol without intravenous contrast. Multiplanar CT image reconstructions of the cervical spine were also generated. COMPARISON:  Head CT 07/22/2014 FINDINGS: CT HEAD FINDINGS Skull and Sinuses:  Forehead contusion without fracture. Orbits: Bilateral cataract resection.  No traumatic finding. Brain: No evidence of acute infarction, hemorrhage, hydrocephalus, or mass lesion/mass effect. Generalized atrophy with ventriculomegaly. Expected/ mild chronic small vessel disease with ischemic gliosis around the lateral ventricles. CT CERVICAL SPINE FINDINGS Negative for acute fracture or subluxation. No prevertebral edema. No gross cervical canal hematoma. Diffuse facet arthropathy and spurring with mild anterolisthesis at C3-4, C4-5, C5-6. Multilevel spondylosis with bulky ventral spurring at C6-7 Stable, compared to 07/29/2013, low-density nodule in the left thyroid measuring 18 mm . IMPRESSION: No evidence of acute intracranial or cervical spine injury. Electronically Signed   By: Marnee Spring M.D.   On: 06/17/2015 16:10   I have personally reviewed and evaluated these images and lab results as part of my medical decision-making.   EKG Interpretation None      MDM   Final diagnoses:  Forehead laceration, initial encounter    81 year old male presents with fall at a nursing home that was  unwitnessed. He is noncontributory historian. His son is at bedside and states that the patient is at his baseline with minimal speech. CT head and neck due to frontal facial impact apparent by injury pattern. Screening radiology chest and pelvis to rule out neck injury. No extremity injuries noted on exam. No acute injuries identified on screening radiology and no acute abnormalities of screening laboratory values. Plan for discharge to the receiving facility after wound care provided for small facial lacerations.  Repaired as above with good approximation. Discharged to care of nursing facility.  Lyndal Pulley, MD 06/17/15 [phone]

## 2015-06-17 NOTE — ED Notes (Signed)
[facility] notified of transport. 

## 2015-06-17 NOTE — Discharge Instructions (Signed)
Facial Laceration ° A facial laceration is a cut on the face. These injuries can be painful and cause bleeding. Lacerations usually heal quickly, but they need special care to reduce scarring. °DIAGNOSIS  °Your health care provider will take a medical history, ask for details about how the injury occurred, and examine the wound to determine how deep the cut is. °TREATMENT  °Some facial lacerations may not require closure. Others may not be able to be closed because of an increased risk of infection. The risk of infection and the chance for successful closure will depend on various factors, including the amount of time since the injury occurred. °The wound may be cleaned to help prevent infection. If closure is appropriate, pain medicines may be given if needed. Your health care provider will use stitches (sutures), wound glue (adhesive), or skin adhesive strips to repair the laceration. These tools bring the skin edges together to allow for faster healing and a better cosmetic outcome. If needed, you may also be given a tetanus shot. °HOME CARE INSTRUCTIONS °· Only take over-the-counter or prescription medicines as directed by your health care provider. °· Follow your health care provider's instructions for wound care. These instructions will vary depending on the technique used for closing the wound. °For Sutures: °· Keep the wound clean and dry.   °· If you were given a bandage (dressing), you should change it at least once a day. Also change the dressing if it becomes wet or dirty, or as directed by your health care provider.   °· Wash the wound with soap and water 2 times a day. Rinse the wound off with water to remove all soap. Pat the wound dry with a clean towel.   °· After cleaning, apply a thin layer of the antibiotic ointment recommended by your health care provider. This will help prevent infection and keep the dressing from sticking.   °· You may shower as usual after the first 24 hours. Do not soak the  wound in water until the sutures are removed.   °· Get your sutures removed as directed by your health care provider. With facial lacerations, sutures should usually be taken out after 4-5 days to avoid stitch marks.   °· Wait a few days after your sutures are removed before applying any makeup. °For Skin Adhesive Strips: °· Keep the wound clean and dry.   °· Do not get the skin adhesive strips wet. You may bathe carefully, using caution to keep the wound dry.   °· If the wound gets wet, pat it dry with a clean towel.   °· Skin adhesive strips will fall off on their own. You may trim the strips as the wound heals. Do not remove skin adhesive strips that are still stuck to the wound. They will fall off in time.   °For Wound Adhesive: °· You may briefly wet your wound in the shower or bath. Do not soak or scrub the wound. Do not swim. Avoid periods of heavy sweating until the skin adhesive has fallen off on its own. After showering or bathing, gently pat the wound dry with a clean towel.   °· Do not apply liquid medicine, cream medicine, ointment medicine, or makeup to your wound while the skin adhesive is in place. This may loosen the film before your wound is healed.   °· If a dressing is placed over the wound, be careful not to apply tape directly over the skin adhesive. This may cause the adhesive to be pulled off before the wound is healed.   °· Avoid   prolonged exposure to sunlight or tanning lamps while the skin adhesive is in place. °· The skin adhesive will usually remain in place for 5-10 days, then naturally fall off the skin. Do not pick at the adhesive film.   °After Healing: °Once the wound has healed, cover the wound with sunscreen during the day for 1 full year. This can help minimize scarring. Exposure to ultraviolet light in the first year will darken the scar. It can take 1-2 years for the scar to lose its redness and to heal completely.  °SEEK MEDICAL CARE IF: °· You have a fever. °SEEK IMMEDIATE  MEDICAL CARE IF: °· You have redness, pain, or swelling around the wound.   °· You see a yellowish-white fluid (pus) coming from the wound.   °  °This information is not intended to replace advice given to you by your health care provider. Make sure you discuss any questions you have with your health care provider. °  °Document Released: 08/05/2004 Document Revised: 07/19/2014 Document Reviewed: 02/08/2013 °Elsevier Interactive Patient Education ©2016 [employer] ° °

## 2015-06-17 NOTE — ED Notes (Signed)
Bed: WA07 Expected date:  Expected time:  Means of arrival:  Comments: Fall to go to rm 7

## 2015-06-17 NOTE — ED Notes (Signed)
Patient transported to CT 

## 2015-06-17 NOTE — Progress Notes (Signed)
CSW met with patient at bedside. Patient appeared to be confused at bedside. Per note, patient has a history of dementia. Nephew in law was present. He confirms that the patient presents to [facility] due to unwitnessed fall. Also, he says that the patient is from [place] and has been a resident there for the past 2 or 3 years.  Nephew in law states that the patient receives assistance with completing his ADL's and that the patient has fallen x3 within the past 6 months. He says the patient's niece is primary support and that she lives in [place].  CSW consulted with nurse who states that transportation has been called for patient.  Niece/ Sundra Aland [phone]  Willette Brace [phone] ED CSW 06/17/2015 6:19 PM

## 2015-06-24 ENCOUNTER — Encounter (HOSPITAL_COMMUNITY): Payer: Self-pay | Admitting: Emergency Medicine

## 2015-06-24 ENCOUNTER — Emergency Department (HOSPITAL_COMMUNITY)

## 2015-06-24 ENCOUNTER — Emergency Department (HOSPITAL_COMMUNITY)
Admission: EM | Admit: 2015-06-24 | Discharge: 2015-06-25 | Disposition: A | Discharge status: Home / Self Care | Attending: Physician Assistant | Admitting: Physician Assistant

## 2015-06-24 DIAGNOSIS — F0390 Unspecified dementia without behavioral disturbance: Secondary | ICD-10-CM | POA: Diagnosis not present

## 2015-06-24 DIAGNOSIS — I10 Essential (primary) hypertension: Secondary | ICD-10-CM | POA: Diagnosis not present

## 2015-06-24 DIAGNOSIS — W19XXXA Unspecified fall, initial encounter: Secondary | ICD-10-CM

## 2015-06-24 DIAGNOSIS — H9190 Unspecified hearing loss, unspecified ear: Secondary | ICD-10-CM | POA: Diagnosis not present

## 2015-06-24 DIAGNOSIS — Z79899 Other long term (current) drug therapy: Secondary | ICD-10-CM | POA: Diagnosis not present

## 2015-06-24 DIAGNOSIS — Z8639 Personal history of other endocrine, nutritional and metabolic disease: Secondary | ICD-10-CM | POA: Insufficient documentation

## 2015-06-24 DIAGNOSIS — Z880 Allergy status to penicillin: Secondary | ICD-10-CM | POA: Insufficient documentation

## 2015-06-24 DIAGNOSIS — S0101XA Laceration without foreign body of scalp, initial encounter: Secondary | ICD-10-CM | POA: Insufficient documentation

## 2015-06-24 DIAGNOSIS — Z792 Long term (current) use of antibiotics: Secondary | ICD-10-CM | POA: Insufficient documentation

## 2015-06-24 DIAGNOSIS — Y9289 Other specified places as the place of occurrence of the external cause: Secondary | ICD-10-CM | POA: Diagnosis not present

## 2015-06-24 DIAGNOSIS — F4310 Post-traumatic stress disorder, unspecified: Secondary | ICD-10-CM | POA: Insufficient documentation

## 2015-06-24 DIAGNOSIS — Z8719 Personal history of other diseases of the digestive system: Secondary | ICD-10-CM | POA: Insufficient documentation

## 2015-06-24 DIAGNOSIS — M1990 Unspecified osteoarthritis, unspecified site: Secondary | ICD-10-CM | POA: Diagnosis not present

## 2015-06-24 DIAGNOSIS — Y998 Other external cause status: Secondary | ICD-10-CM | POA: Diagnosis not present

## 2015-06-24 DIAGNOSIS — W07XXXA Fall from chair, initial encounter: Secondary | ICD-10-CM | POA: Diagnosis not present

## 2015-06-24 DIAGNOSIS — Y9389 Activity, other specified: Secondary | ICD-10-CM | POA: Insufficient documentation

## 2015-06-24 DIAGNOSIS — Z87438 Personal history of other diseases of male genital organs: Secondary | ICD-10-CM | POA: Diagnosis not present

## 2015-06-24 DIAGNOSIS — F329 Major depressive disorder, single episode, unspecified: Secondary | ICD-10-CM | POA: Insufficient documentation

## 2015-06-24 MED ORDER — LIDOCAINE-EPINEPHRINE (PF) 2 %-1:200000 IJ SOLN: 10.0000 mL | Freq: Once | INTRAMUSCULAR | Status: DC

## 2015-06-24 NOTE — ED Notes (Signed)
[facility] Staff states that pt fell forward out of his chair. EMS reports 5cm laceration on head with arterial bleed on arrival. Bleed is now controlled. EMS reports patient was hypertensive for ride here. No blood thinners in medication list, pt has dementia and is disoriented x4 at baseline. Staff states he was responding appropriately for baseline after fall. EMS reports patient as unresponsive en route. EMS reports bradycardia with elevation in lateral leads. 20g placed in left forearm.

## 2015-06-24 NOTE — ED Notes (Signed)
Pt's dressing bled through, Dr. Margreta JourneyGunalda at bedside holding pressure. Sutures and staples placed by Dr. Margreta JourneyGunalda to attempt to stop bleeding. Pressure held again and dressed with ABD and keflex holding pressure.

## 2015-06-24 NOTE — Discharge Instructions (Signed)
Laceration Care, Adult °A laceration is a cut that goes through all of the layers of the skin and into the tissue that is right under the skin. Some lacerations heal on their own. Others need to be closed with stitches (sutures), staples, skin adhesive strips, or skin glue. Proper laceration care minimizes the risk of infection and helps the laceration to heal better. °HOW TO CARE FOR YOUR LACERATION °If sutures or staples were used: °· Keep the wound clean and dry. °· If you were given a bandage (dressing), you should change it at least one time per day or as told by your health care provider. You should also change it if it becomes wet or dirty. °· Keep the wound completely dry for the first 24 hours or as told by your health care provider. After that time, you may shower or bathe. However, make sure that the wound is not soaked in water until after the sutures or staples have been removed. °· Clean the wound one time each day or as told by your health care provider: °· Wash the wound with soap and water. °· Rinse the wound with water to remove all soap. °· Pat the wound dry with a clean towel. Do not rub the wound. °· After cleaning the wound, apply a thin layer of antibiotic ointment as told by your health care provider. This will help to prevent infection and keep the dressing from sticking to the wound. °· Have the sutures or staples removed as told by your health care provider. °If skin adhesive strips were used: °· Keep the wound clean and dry. °· If you were given a bandage (dressing), you should change it at least one time per day or as told by your health care provider. You should also change it if it becomes dirty or wet. °· Do not get the skin adhesive strips wet. You may shower or bathe, but be careful to keep the wound dry. °· If the wound gets wet, pat it dry with a clean towel. Do not rub the wound. °· Skin adhesive strips fall off on their own. You may trim the strips as the wound heals. Do not  remove skin adhesive strips that are still stuck to the wound. They will fall off in time. °If skin glue was used: °· Try to keep the wound dry, but you may briefly wet it in the shower or bath. Do not soak the wound in water, such as by swimming. °· After you have showered or bathed, gently pat the wound dry with a clean towel. Do not rub the wound. °· Do not do any activities that will make you sweat heavily until the skin glue has fallen off on its own. °· Do not apply liquid, cream, or ointment medicine to the wound while the skin glue is in place. Using those may loosen the film before the wound has healed. °· If you were given a bandage (dressing), you should change it at least one time per day or as told by your health care provider. You should also change it if it becomes dirty or wet. °· If a dressing is placed over the wound, be careful not to apply tape directly over the skin glue. Doing that may cause the glue to be pulled off before the wound has healed. °· Do not pick at the glue. The skin glue usually remains in place for 5-10 days, then it falls off of the skin. °General Instructions °· Take over-the-counter and prescription   medicines only as told by your health care provider. °· If you were prescribed an antibiotic medicine or ointment, take or apply it as told by your doctor. Do not stop using it even if your condition improves. °· To help prevent scarring, make sure to cover your wound with sunscreen whenever you are outside after stitches are removed, after adhesive strips are removed, or when glue remains in place and the wound is healed. Make sure to wear a sunscreen of at least 30 SPF. °· Do not scratch or pick at the wound. °· Keep all follow-up visits as told by your health care provider. This is important. °· Check your wound every day for signs of infection. Watch for: °· Redness, swelling, or pain. °· Fluid, blood, or pus. °· Raise (elevate) the injured area above the level of your heart  while you are sitting or lying down, if possible. °SEEK MEDICAL CARE IF: °· You received a tetanus shot and you have swelling, severe pain, redness, or bleeding at the injection site. °· You have a fever. °· A wound that was closed breaks open. °· You notice a bad smell coming from your wound or your dressing. °· You notice something coming out of the wound, such as wood or glass. °· Your pain is not controlled with medicine. °· You have increased redness, swelling, or pain at the site of your wound. °· You have fluid, blood, or pus coming from your wound. °· You notice a change in the color of your skin near your wound. °· You need to change the dressing frequently due to fluid, blood, or pus draining from the wound. °· You develop a new rash. °· You develop numbness around the wound. °SEEK IMMEDIATE MEDICAL CARE IF: °· You develop severe swelling around the wound. °· Your pain suddenly increases and is severe. °· You develop painful lumps near the wound or on skin that is anywhere on your body. °· You have a red streak going away from your wound. °· The wound is on your hand or foot and you cannot properly move a finger or toe. °· The wound is on your hand or foot and you notice that your fingers or toes look pale or bluish. °  °This information is not intended to replace advice given to you by your health care provider. Make sure you discuss any questions you have with your health care provider. °  °Document Released: 06/28/2005 Document Revised: 11/12/2014 Document Reviewed: 06/24/2014 °Elsevier Interactive Patient Education ©2016 [employer] ° °Head Injury, Adult °You have a head injury. Headaches and throwing up (vomiting) are common after a head injury. It should be easy to wake up from sleeping. Sometimes you must stay in the hospital. Most problems happen within the first 24 hours. Side effects may occur up to 7-10 days after the injury.  °WHAT ARE THE TYPES OF HEAD INJURIES? °Head injuries can be as  minor as a bump. Some head injuries can be more severe. More severe head injuries include: °· A jarring injury to the brain (concussion). °· A bruise of the brain (contusion). This mean there is bleeding in the brain that can cause swelling. °· A cracked skull (skull fracture). °· Bleeding in the brain that collects, clots, and forms a bump (hematoma). °WHEN SHOULD I GET HELP RIGHT AWAY?  °· You are confused or sleepy. °· You cannot be woken up. °· You feel sick to your stomach (nauseous) or keep throwing up (vomiting). °· Your dizziness or   unsteadiness is getting worse. °· You have very bad, lasting headaches that are not helped by medicine. Take medicines only as told by your doctor. °· You cannot use your arms or legs like normal. °· You cannot walk. °· You notice changes in the black spots in the center of the colored part of your eye (pupil). °· You have clear or bloody fluid coming from your nose or ears. °· You have trouble seeing. °During the next 24 hours after the injury, you must stay with someone who can watch you. This person should get help right away (call 911 in the [place] if you start to shake and are not able to control it (have seizures), you pass out, or you are unable to wake up. °HOW CAN I PREVENT A HEAD INJURY IN THE FUTURE? °· Wear seat belts. °· Wear a helmet while bike riding and playing sports like football. °· Stay away from dangerous activities around the house. °WHEN CAN I RETURN TO NORMAL ACTIVITIES AND ATHLETICS? °See your doctor before doing these activities. You should not do normal activities or play contact sports until 1 week after the following symptoms have stopped: °· Headache that does not go away. °· Dizziness. °· Poor attention. °· Confusion. °· Memory problems. °· Sickness to your stomach or throwing up. °· Tiredness. °· Fussiness. °· Bothered by bright lights or loud noises. °· Anxiousness or depression. °· Restless sleep. °MAKE SURE YOU:  °· Understand these  instructions. °· Will watch your condition. °· Will get help right away if you are not doing well or get worse. °  °This information is not intended to replace advice given to you by your health care provider. Make sure you discuss any questions you have with your health care provider. °  °Document Released: 06/10/2008 Document Revised: 07/19/2014 Document Reviewed: 03/05/2013 °Elsevier Interactive Patient Education ©2016 [employer]. ° °

## 2015-06-24 NOTE — ED Provider Notes (Signed)
CSN: [id number]     Arrival date & time 06/24/15  1934 History   First MD Initiated Contact with Patient 06/24/15 1950     Chief Complaint  Patient presents with  . Fall  . Head Laceration    Patient is a 81 y.o. male presenting with fall and scalp laceration. The history is provided by the EMS personnel and a relative.  Fall This is a new problem. The current episode started today. The problem has been unchanged. Nothing aggravates the symptoms.  Head Laceration This is a new problem. The current episode started today. The problem has been unchanged. Nothing aggravates the symptoms.    Past Medical History  Diagnosis Date  . Hearing loss   . Dementia 2013  . PTSD (post-traumatic stress disorder)   . Hyperlipidemia   . GERD (gastroesophageal reflux disease)   . Allergic rhinitis due to pollen   . Osteoarthrosis involving, or with mention of more than one site, but not specified as generalized, multiple sites   . HTN (hypertension), benign   . Depression   . BPH (benign prostatic hypertrophy)    Past Surgical History  Procedure Laterality Date  . Appendectomy    . Ear lobe    . Cataract extraction    . Nose surgery    . Tonsillectomy     Family History  Problem Relation Age of Onset  . Diabetes Father   . Hypertension Father   . Gout      NOT KNOWN   Social History  Substance Use Topics  . Smoking status: Never Smoker   . Smokeless tobacco: Never Used  . Alcohol Use: No    Review of Systems  Unable to perform ROS: Dementia   Allergies  Metoprolol and Penicillins  Home Medications   Prior to Admission medications   Medication Sig Start Date End Date Taking? Authorizing Provider  acetaminophen (TYLENOL) 500 MG tablet Take 500 mg by mouth 3 (three) times daily.    Yes Historical Provider, MD  cholecalciferol (VITAMIN D) 1000 UNITS tablet Take 2,000 Units by mouth daily.   Yes Historical Provider, MD  clindamycin (CLEOCIN T) 1 % external solution Apply 1  application topically 2 (two) times daily.   Yes Historical Provider, MD  ENSURE PLUS (ENSURE PLUS) LIQD Take 237 mLs by mouth 2 (two) times daily between meals.   Yes Historical Provider, MD  furosemide (LASIX) 20 MG tablet Take 20 mg by mouth daily.    Yes Historical Provider, MD  guaiFENesin-codeine (ROBITUSSIN AC) 100-10 MG/5ML syrup Take 10 mLs by mouth every 4 (four) hours as needed for cough or congestion.   Yes Historical Provider, MD  ketoconazole (NIZORAL) 2 % shampoo Apply 1 application topically 2 (two) times a week.   Yes Historical Provider, MD  lisinopril (PRINIVIL,ZESTRIL) 20 MG tablet Take 20 mg by mouth daily.   Yes Historical Provider, MD  liver oil-zinc oxide (DESITIN) 40 % ointment Apply 1 application topically as needed for irritation.   Yes Historical Provider, MD  loperamide (IMODIUM) 2 MG capsule Take 2 mg by mouth as needed for diarrhea or loose stools (diarrhea).   Yes Historical Provider, MD  LORazepam (ATIVAN) 0.5 MG tablet Take 0.5 mg by mouth 2 (two) times daily. Take 1 tab once daily at 1pm & 5 pm. May also take q4hprn for anxiety   Yes Historical Provider, MD  Melatonin 5 MG TABS Take 5 mg by mouth at bedtime as needed (sleep).    Yes Historical  Provider, MD  mirtazapine (REMERON) 30 MG tablet Take 30 mg by mouth at bedtime.   Yes Historical Provider, MD  multivitamin-lutein (OCUVITE-LUTEIN) CAPS Take 1 capsule by mouth 2 (two) times daily.   Yes Historical Provider, MD  neomycin-bacitracin-polymyxin (NEOSPORIN) [phone] ointment Apply 1 application topically daily as needed (wound care).   Yes Historical Provider, MD  polyethylene glycol (MIRALAX / GLYCOLAX) packet Take 17 g by mouth daily as needed for mild constipation.   Yes Historical Provider, MD  sertraline (ZOLOFT) 25 MG tablet Take 25 mg by mouth daily.   Yes Historical Provider, MD  sulfamethoxazole-trimethoprim (BACTRIM DS,SEPTRA DS) 800-160 MG per tablet Take 1 tablet by mouth 2 (two) times daily.   Yes  Historical Provider, MD   BP 191/65 mmHg  Pulse 55  Temp(Src) 97.6 F (36.4 C) (Axillary)  Resp 13  SpO2 98% Physical Exam  Constitutional: He appears well-developed and well-nourished. No distress.  HENT:  Head: Normocephalic and atraumatic.  Mouth/Throat: Oropharynx is clear and moist.  Eyes: EOM are normal.  Neck: Neck supple. No JVD present.  Cardiovascular: Normal rate, regular rhythm, normal heart sounds and intact distal pulses.   Pulmonary/Chest: Effort normal and breath sounds normal.  Abdominal: Soft. He exhibits no distension. There is no tenderness.  Musculoskeletal: Normal range of motion. He exhibits no edema.  Neurological: He is alert. He is disoriented. GCS eye subscore is 3. GCS verbal subscore is 4. GCS motor subscore is 5.  Exam limited due to severe dementia  Skin: Skin is warm and dry.  Psychiatric: His behavior is normal.    ED Course  .Marland KitchenLaceration Repair Date/Time: 06/24/2015 11:15 PM Performed by: Gustavus Bryant Authorized by: Gustavus Bryant Consent: Verbal consent obtained. Body area: head/neck Location details: scalp Laceration length: 3 cm Foreign bodies: no foreign bodies Tendon involvement: none Nerve involvement: none Vascular damage: yes (controlled with pressure) Debridement: none Number of sutures: 6 staples. Approximation difficulty: simple     Labs Review Labs Reviewed - No data to display  Imaging Review Ct Head Wo Contrast  06/24/2015  CLINICAL DATA:  Status post fall out of wheelchair, with laceration at the head. Concern for head or cervical spine injury. Initial encounter. EXAM: CT HEAD WITHOUT CONTRAST CT CERVICAL SPINE WITHOUT CONTRAST TECHNIQUE: Multidetector CT imaging of the head and cervical spine was performed following the standard protocol without intravenous contrast. Multiplanar CT image reconstructions of the cervical spine were also generated. COMPARISON:  CT of the head and cervical spine performed 06/17/2015  FINDINGS: CT HEAD FINDINGS There is no evidence of acute infarction, mass lesion, or intra- or extra-axial hemorrhage on CT. Prominence of the ventricles and sulci reflects moderate cortical volume loss. Mild periventricular white matter change likely reflects small vessel ischemic microangiopathy. The brainstem and fourth ventricle are within normal limits. The basal ganglia are unremarkable in appearance. The cerebral hemispheres demonstrate grossly normal gray-white differentiation. No mass effect or midline shift is seen. There is no evidence of fracture; visualized osseous structures are unremarkable in appearance. The orbits are within normal limits. The paranasal sinuses and mastoid air cells are well-aerated. A soft tissue laceration is noted at the upper forehead, with associated skin staples. CT CERVICAL SPINE FINDINGS There is no evidence of acute fracture or subluxation. There is minimal grade 1 anterolisthesis of C3 on C4 and of C4 on C5. Scattered anterior and small posterior disc osteophyte complexes are seen along the cervical spine. Vertebral bodies demonstrate normal height. Intervertebral disc spaces are preserved. Prevertebral soft  tissues are within normal limits. Mild underlying facet disease is noted along the cervical spine. A 1.7 cm hypodensity is noted within the left thyroid lobe. The visualized lung apices are clear. Scattered calcification is noted at the carotid bifurcations bilaterally. IMPRESSION: 1. No evidence of traumatic intracranial injury or fracture. 2. No evidence of acute fracture or subluxation along the cervical spine. 3. Soft tissue laceration at the upper forehead, with associated skin staples. 4. Moderate cortical volume loss and scattered small vessel ischemic microangiopathy. 5. Mild degenerative change along the cervical spine. 6. 1.7 cm hypodensity within the left thyroid lobe. Consider further evaluation with thyroid ultrasound. If patient is clinically  hyperthyroid, consider nuclear medicine thyroid uptake and scan. 7. Scattered calcification at the carotid bifurcations bilaterally. Carotid ultrasound would be helpful for further evaluation, when and as deemed clinically appropriate. Electronically Signed   By: Garald Balding M.D.   On: 06/24/2015 22:25   Ct Cervical Spine Wo Contrast  06/24/2015  CLINICAL DATA:  Status post fall out of wheelchair, with laceration at the head. Concern for head or cervical spine injury. Initial encounter. EXAM: CT HEAD WITHOUT CONTRAST CT CERVICAL SPINE WITHOUT CONTRAST TECHNIQUE: Multidetector CT imaging of the head and cervical spine was performed following the standard protocol without intravenous contrast. Multiplanar CT image reconstructions of the cervical spine were also generated. COMPARISON:  CT of the head and cervical spine performed 06/17/2015 FINDINGS: CT HEAD FINDINGS There is no evidence of acute infarction, mass lesion, or intra- or extra-axial hemorrhage on CT. Prominence of the ventricles and sulci reflects moderate cortical volume loss. Mild periventricular white matter change likely reflects small vessel ischemic microangiopathy. The brainstem and fourth ventricle are within normal limits. The basal ganglia are unremarkable in appearance. The cerebral hemispheres demonstrate grossly normal gray-white differentiation. No mass effect or midline shift is seen. There is no evidence of fracture; visualized osseous structures are unremarkable in appearance. The orbits are within normal limits. The paranasal sinuses and mastoid air cells are well-aerated. A soft tissue laceration is noted at the upper forehead, with associated skin staples. CT CERVICAL SPINE FINDINGS There is no evidence of acute fracture or subluxation. There is minimal grade 1 anterolisthesis of C3 on C4 and of C4 on C5. Scattered anterior and small posterior disc osteophyte complexes are seen along the cervical spine. Vertebral bodies  demonstrate normal height. Intervertebral disc spaces are preserved. Prevertebral soft tissues are within normal limits. Mild underlying facet disease is noted along the cervical spine. A 1.7 cm hypodensity is noted within the left thyroid lobe. The visualized lung apices are clear. Scattered calcification is noted at the carotid bifurcations bilaterally. IMPRESSION: 1. No evidence of traumatic intracranial injury or fracture. 2. No evidence of acute fracture or subluxation along the cervical spine. 3. Soft tissue laceration at the upper forehead, with associated skin staples. 4. Moderate cortical volume loss and scattered small vessel ischemic microangiopathy. 5. Mild degenerative change along the cervical spine. 6. 1.7 cm hypodensity within the left thyroid lobe. Consider further evaluation with thyroid ultrasound. If patient is clinically hyperthyroid, consider nuclear medicine thyroid uptake and scan. 7. Scattered calcification at the carotid bifurcations bilaterally. Carotid ultrasound would be helpful for further evaluation, when and as deemed clinically appropriate. Electronically Signed   By: Garald Balding M.D.   On: 06/24/2015 22:25   I have personally reviewed and evaluated these images and lab results as part of my medical decision-making.  MDM   Final diagnoses:  Fall, initial encounter  Scalp laceration, initial encounter    Patient is a 81 year old male with a history of dementia who had a witnessed fall from standing. Patient was standing up from his wheelchair when he fell forward hitting his head on the carpeted ground. There was no loss of consciousness. Patient is behaving at baseline per the nursing home staff. Patient has approximately 2 similar laceration on the scalp which has a small arterial bleed which is controlled with direct pressure. He is not anticoagulated. Bulky dressing was applied. CT head and C-spine obtained. C-spine immobilization maintained at this time.  CT  negative for fracture or intracranial bleed. Wound repaired as detailed above. I spoke with the patient's PCP Dr. Dillard Essex regarding these findings. She requested the patient be sent home with a staple removal kit for follow up wound check. I also inspected a chronic groin wound at her request which does not appear acutely infected or resemble a fistula. Recommend continued local wound management. C collar cleared by me. Stable for d/c back to his facility.  Discussed with Dr. Vanita Panda.  Gustavus Bryant, MD 06/24/15 [id number]  Carmin Muskrat, MD 06/25/15 [phone]

## 2015-06-24 NOTE — ED Notes (Signed)
Pt cleaned and wound dressed for transport back to facility

## 2015-06-24 NOTE — ED Notes (Signed)
Patient transported to CT 

## 2015-08-13 DEATH — deceased
